# Patient Record
Sex: Female | Born: 1940 | Race: White | Hispanic: No
Health system: Southern US, Community
[De-identification: ages and names within clinical notes are randomized; demographics above are authoritative.]

---

## 2005-03-27 ENCOUNTER — Emergency Department: Payer: Self-pay | Admitting: Emergency Medicine

## 2005-07-13 ENCOUNTER — Ambulatory Visit: Payer: Self-pay | Admitting: Internal Medicine

## 2005-07-29 ENCOUNTER — Ambulatory Visit: Payer: Self-pay | Admitting: *Deleted

## 2005-11-13 ENCOUNTER — Emergency Department: Payer: Self-pay | Admitting: Emergency Medicine

## 2005-11-13 ENCOUNTER — Other Ambulatory Visit: Payer: Self-pay

## 2007-09-16 ENCOUNTER — Other Ambulatory Visit: Payer: Self-pay

## 2007-09-16 ENCOUNTER — Inpatient Hospital Stay: Payer: Self-pay | Admitting: Internal Medicine

## 2007-09-21 ENCOUNTER — Encounter: Payer: Self-pay | Admitting: Internal Medicine

## 2007-10-01 ENCOUNTER — Encounter: Payer: Self-pay | Admitting: Internal Medicine

## 2008-02-09 ENCOUNTER — Other Ambulatory Visit: Payer: Self-pay

## 2008-02-09 ENCOUNTER — Inpatient Hospital Stay: Payer: Self-pay | Admitting: Orthopedic Surgery

## 2008-02-13 ENCOUNTER — Encounter: Payer: Self-pay | Admitting: Internal Medicine

## 2008-08-06 ENCOUNTER — Ambulatory Visit: Payer: Self-pay | Admitting: Orthopedic Surgery

## 2008-08-08 ENCOUNTER — Ambulatory Visit: Payer: Self-pay | Admitting: Family Medicine

## 2008-09-10 ENCOUNTER — Ambulatory Visit: Payer: Self-pay | Admitting: Orthopedic Surgery

## 2009-08-14 ENCOUNTER — Inpatient Hospital Stay: Payer: Self-pay | Admitting: Orthopedic Surgery

## 2009-08-26 ENCOUNTER — Encounter: Payer: Self-pay | Admitting: Internal Medicine

## 2009-08-30 ENCOUNTER — Encounter: Payer: Self-pay | Admitting: Internal Medicine

## 2009-09-18 ENCOUNTER — Ambulatory Visit: Payer: Self-pay | Admitting: Internal Medicine

## 2009-09-30 ENCOUNTER — Encounter: Payer: Self-pay | Admitting: Internal Medicine

## 2009-10-03 ENCOUNTER — Inpatient Hospital Stay: Payer: Self-pay | Admitting: Internal Medicine

## 2009-11-02 ENCOUNTER — Inpatient Hospital Stay: Payer: Self-pay | Admitting: Internal Medicine

## 2010-05-12 ENCOUNTER — Ambulatory Visit: Payer: Self-pay | Admitting: Specialist

## 2010-05-19 ENCOUNTER — Ambulatory Visit: Payer: Self-pay | Admitting: Ophthalmology

## 2010-05-29 ENCOUNTER — Ambulatory Visit: Payer: Self-pay | Admitting: Cardiovascular Disease

## 2010-06-01 ENCOUNTER — Ambulatory Visit: Payer: Self-pay | Admitting: Ophthalmology

## 2010-09-08 ENCOUNTER — Ambulatory Visit: Payer: Self-pay | Admitting: Gastroenterology

## 2010-11-19 ENCOUNTER — Ambulatory Visit: Payer: Self-pay | Admitting: Specialist

## 2011-05-14 ENCOUNTER — Other Ambulatory Visit: Payer: Self-pay | Admitting: Family Medicine

## 2011-07-22 ENCOUNTER — Other Ambulatory Visit: Payer: Self-pay | Admitting: Family Medicine

## 2011-08-27 IMAGING — CT CT CHEST W/ CM
1 series · 15 of 32 positions shown, 19 images · IV contrast (APPLIED)
Comparison: none

REASON FOR EXAM: ACUTE HYPOXIA SOB  call report    [id number]
COMMENTS:

[Series 4: soft tissue · axial · 0.78mm/px · z∈[-723,-462]mm · 15 of 98 slices shown, 19 images]
[im 7/98  soft-tissue]
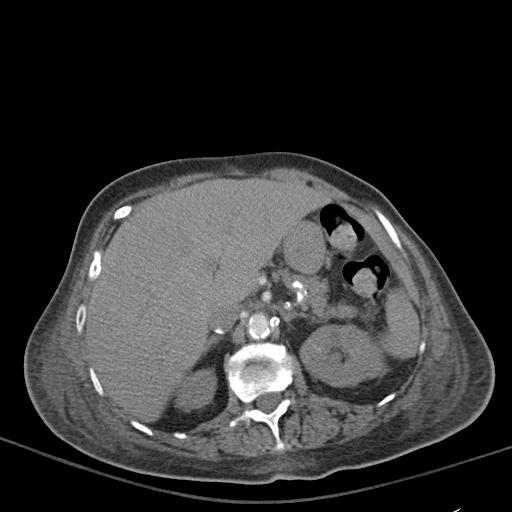
[im 7/98  bone]
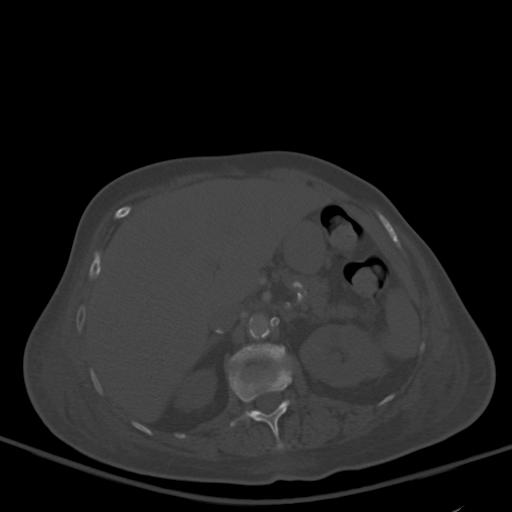
[im 13/98  soft-tissue]
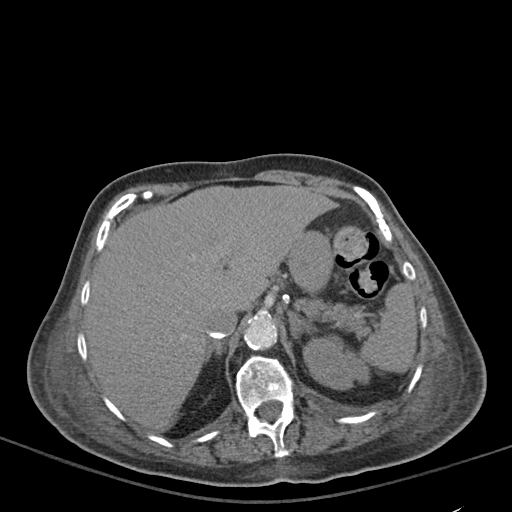
[im 19/98  soft-tissue]
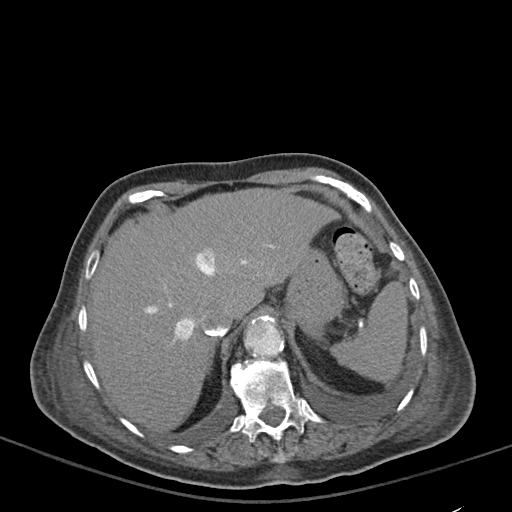
[im 29/98  soft-tissue]
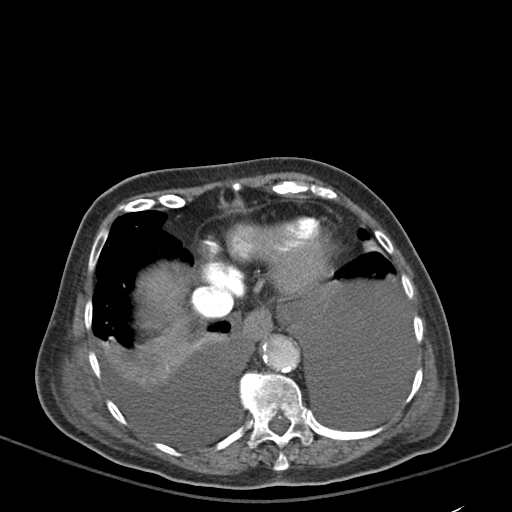
[im 35/98  soft-tissue]
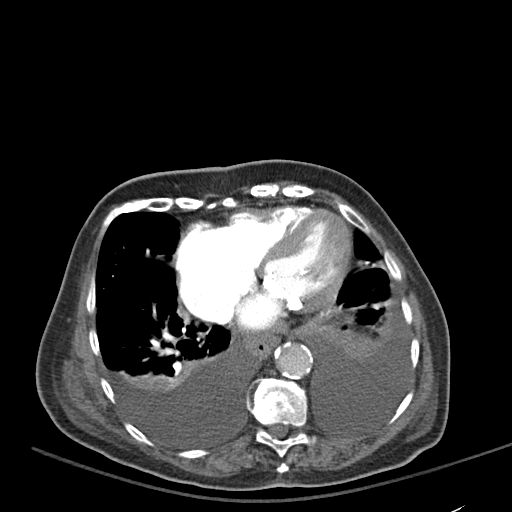
[im 41/98  soft-tissue]
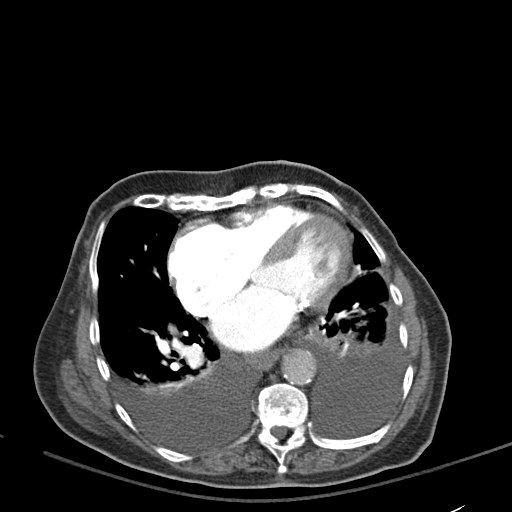
[im 51/98  soft-tissue]
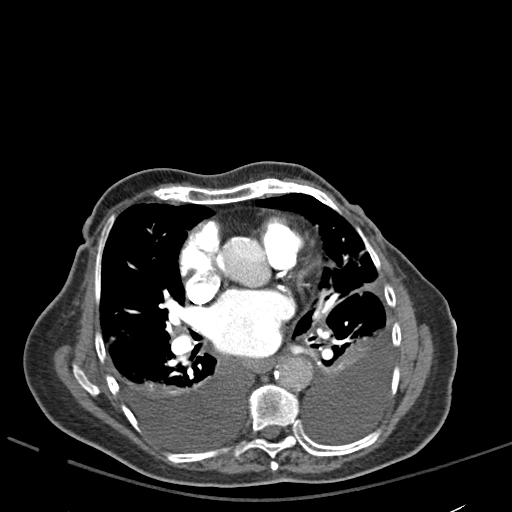
[im 57/98  soft-tissue]
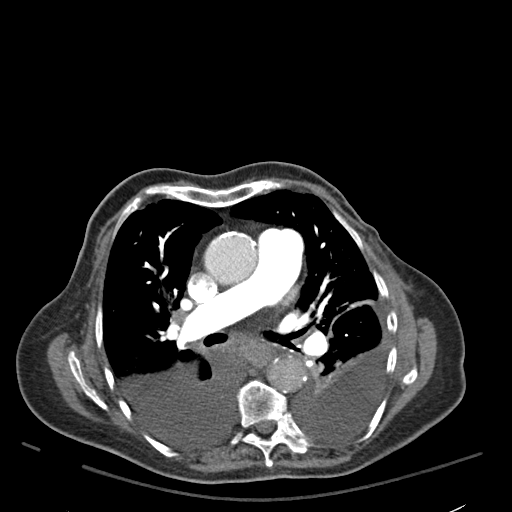
[im 63/98  soft-tissue]
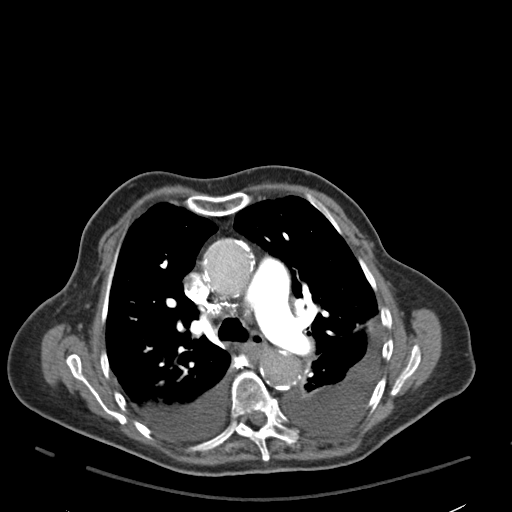
[im 63/98  bone]
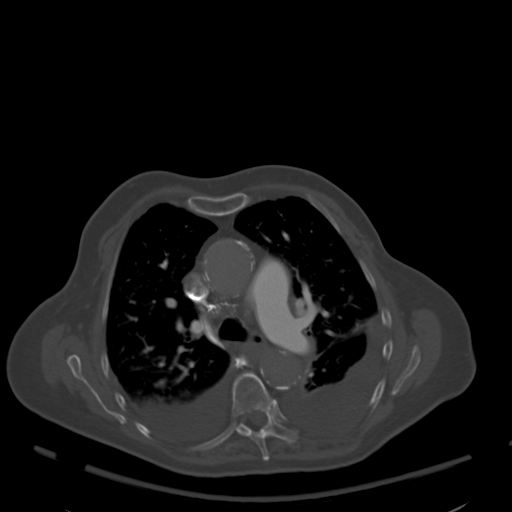
[im 69/98  soft-tissue]
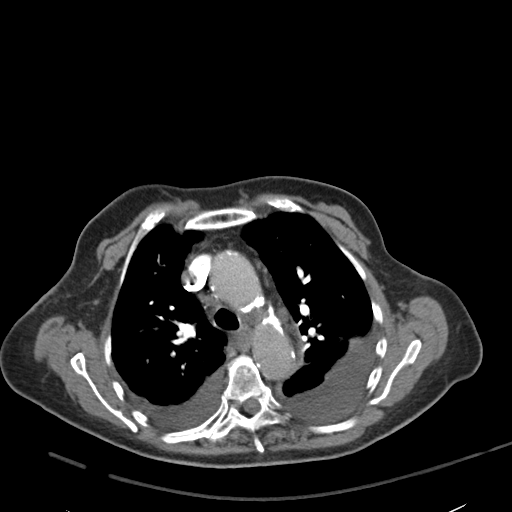
[im 79/98  soft-tissue]
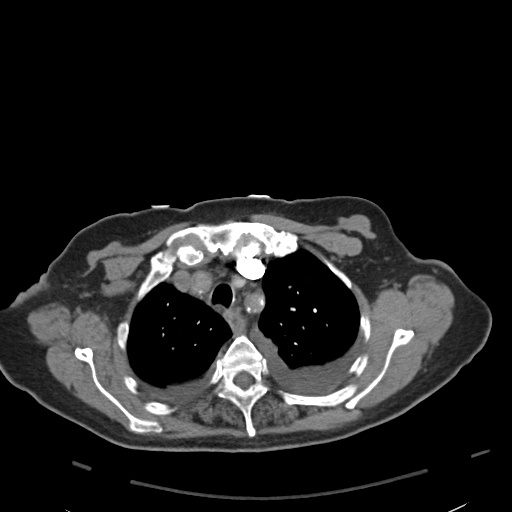
[im 85/98  soft-tissue]
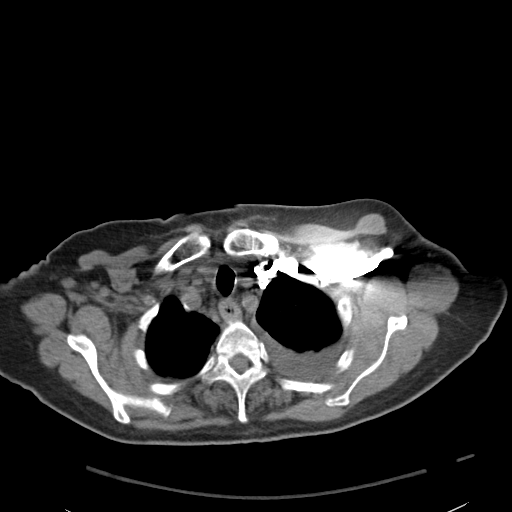
[im 85/98  lung]
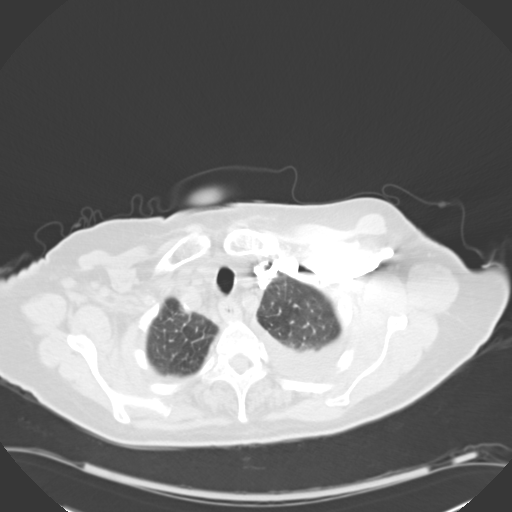
[im 88/98  lung]
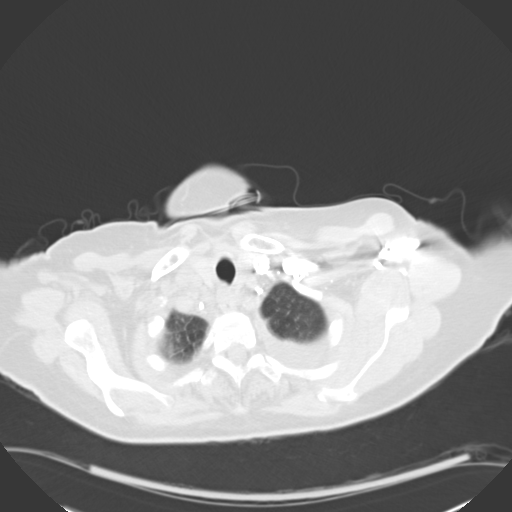
[im 91/98  soft-tissue]
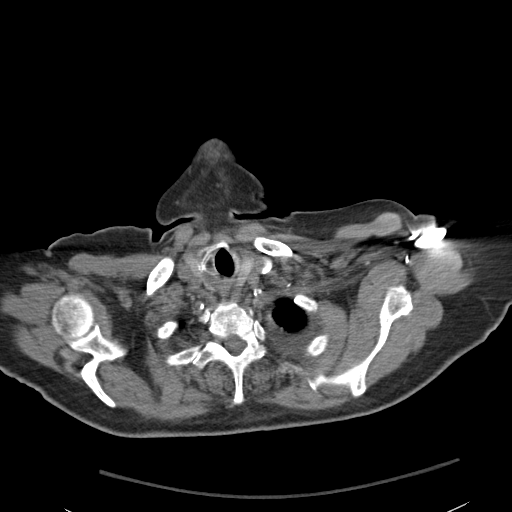
[im 91/98  lung]
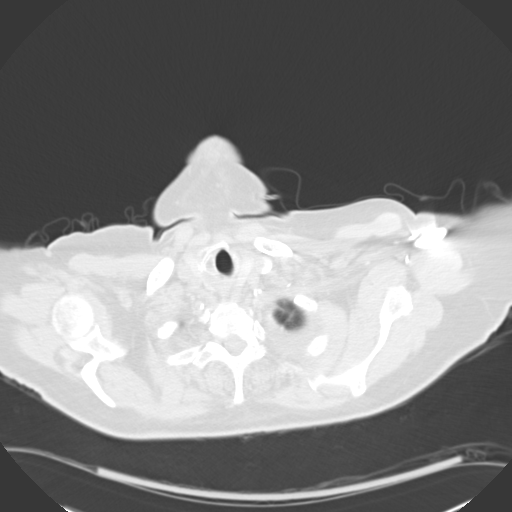
[im 94/98  lung]
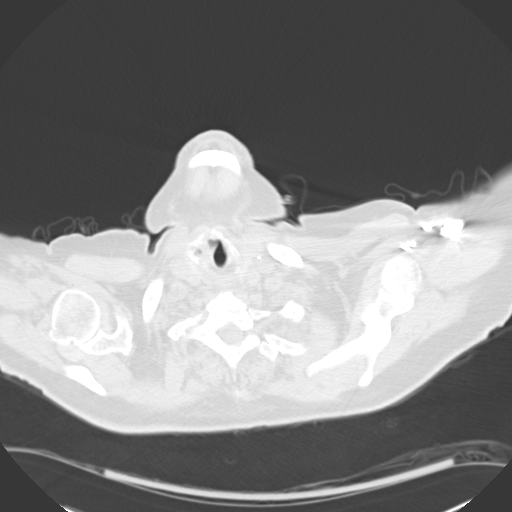

[15 of 32 positions shown; findings below may reference images not displayed]

PROCEDURE:     CT  - CT CHEST (FOR PE) W  - September 18, 2009  [DATE]

RESULT:     Axial CT scanning was performed through the chest at 3 mm
intervals and slice thicknesses following intravenous administration of 70
cc of Isovue 370. Review of 3-dimensional reconstructed images was performed
separately on the webspace server monitor.

There are moderate-sized bilateral pleural effusions layering posteriorly.
Contrast within the pulmonary arterial tree is normal in appearance. I do
not see evidence of acute pulmonary embolism. The cardiac chambers are
enlarged. The caliber of the thoracic aorta is normal. There is a hiatal
hernia present. I do not see pathologic sized mediastinal or hilar lymph
nodes.

At lung window settings there are increased interstitial markings diffusely
consistent with interstitial edema. The thoracic vertebral bodies are
preserved in height. There is a 5 mm diameter nodule posteroinferiorly in
the right middle lobe demonstrated on image 38. Within the upper abdomen the
observed portions of the liver and spleen appear normal. There is mild
fullness of the left adrenal gland but no discrete mass is seen.
IMPRESSION: 1. I do not see evidence of an acute pulmonary embolism.
2. There are moderate-sized bilateral pleural effusions.
3. There is enlargement of the cardiac chambers and there is increased
interstitial density within both lungs consistent with CHF.

This report was called to Dr. Carline at the conclusion of the study.

## 2011-09-11 IMAGING — CR DG CHEST 1V PORT
1 series · 1 of 1 positions shown · non-contrast
Comparison: none

REASON FOR EXAM: SOB
COMMENTS:

[view not recorded]
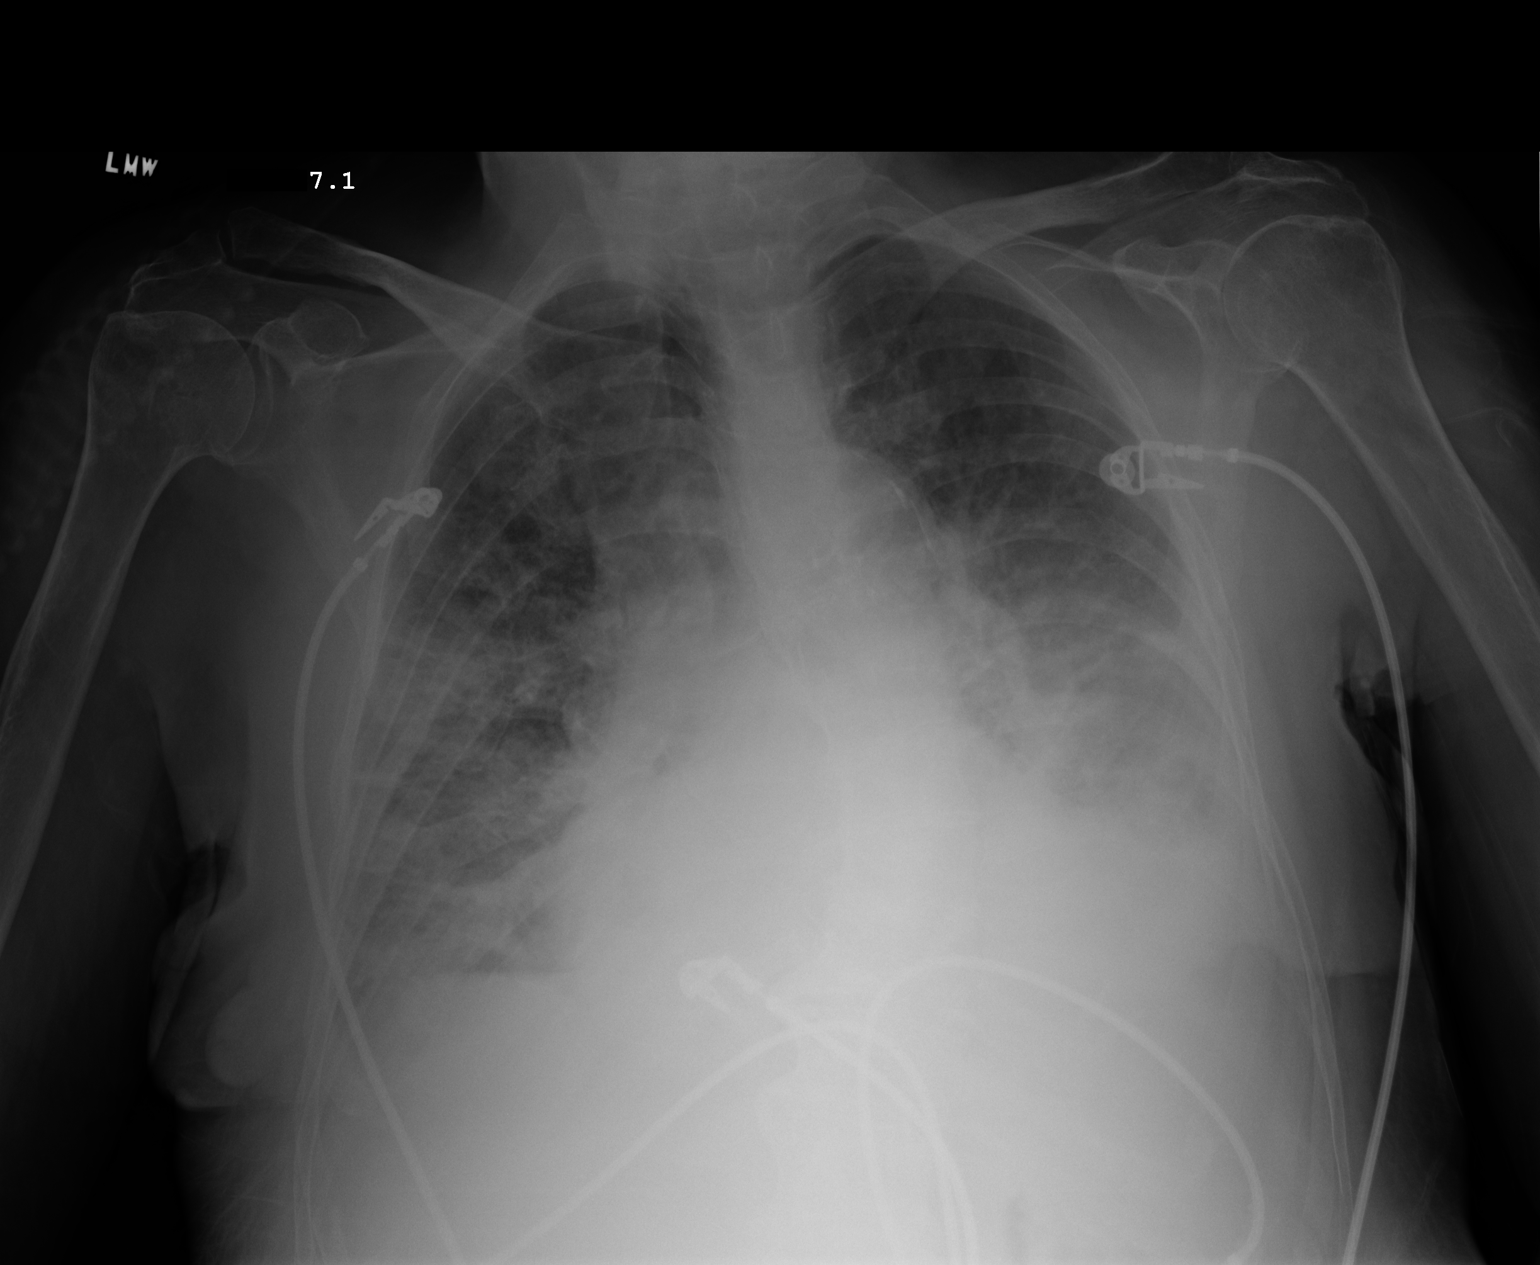

[1 of 1 positions shown; findings below may reference images not displayed]

PROCEDURE:     DXR - DXR PORTABLE CHEST SINGLE VIEW  - October 03, 2009  [DATE]

RESULT:     Comparison is made to the study 21 October, 2008.

The lungs are adequately inflated. The interstitial markings are increased
diffusely and have become confluent. The cardiac silhouette is top normal to
mildly enlarged but its margins are ill-defined. The left hemidiaphragm is
obscured.
IMPRESSION: The findings are consistent with congestive heart failure
with acute pulmonary edema. There may be pleural fluid on the left. Followup
films following therapy are recommended to assure clearing. Certainly
bilateral pneumonia could produce similar findings.

## 2011-09-11 IMAGING — US US EXTREM LOW VENOUS BILAT
1 series · 17 of 24 positions shown · non-contrast
Comparison: none

REASON FOR EXAM: swelling
COMMENTS:

[Series 1: us extrem low venous bilat · 17 of 43 slices shown]
[im 1/43]
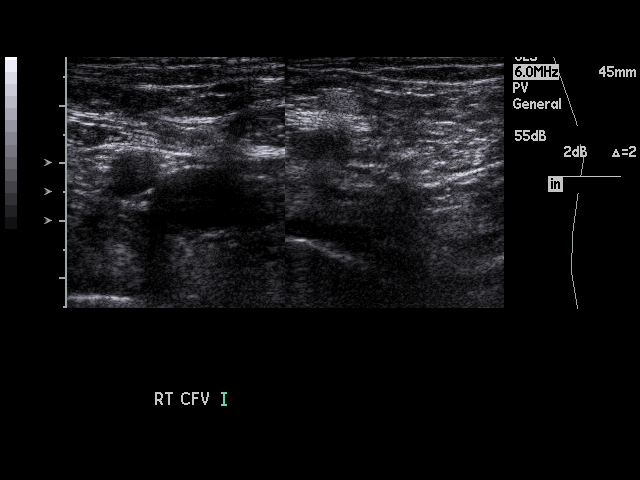
[im 4/43]
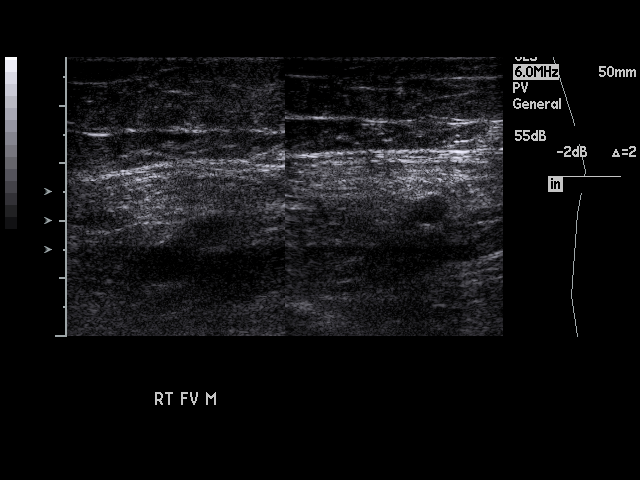
[im 6/43]
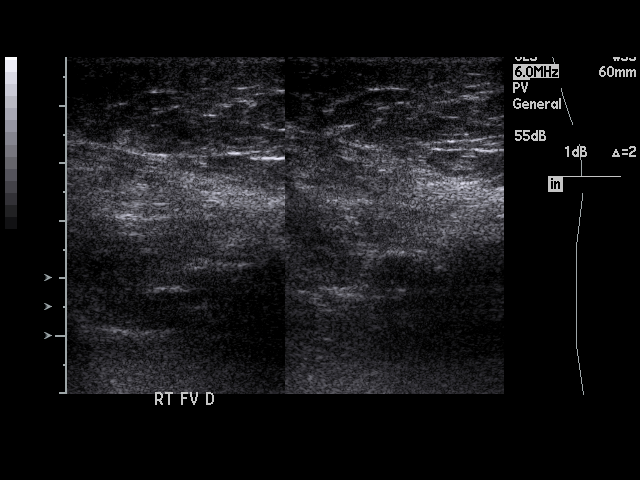
[im 8/43]
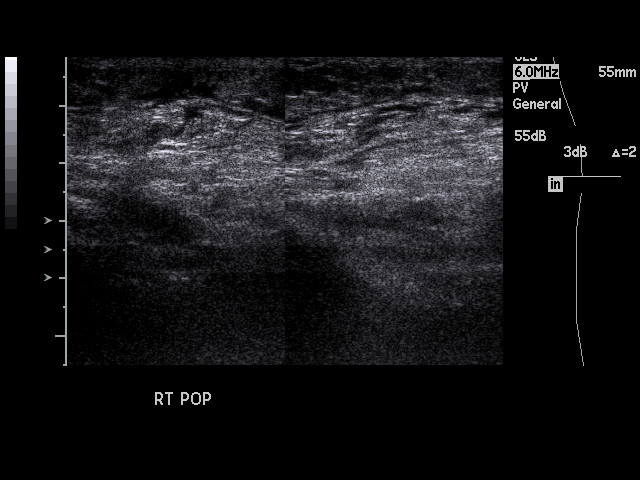
[im 11/43]
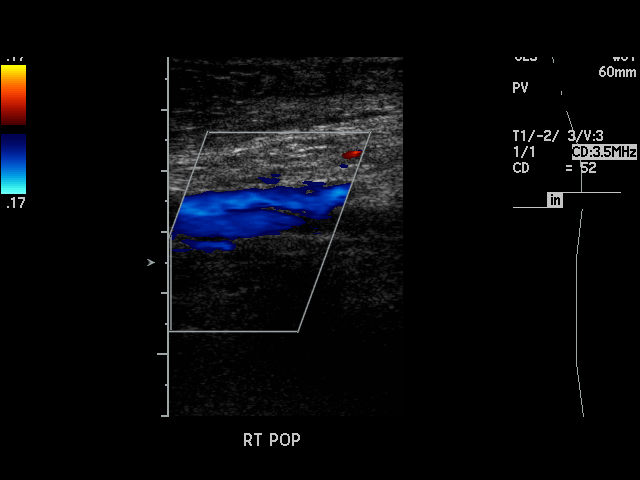
[im 13/43]
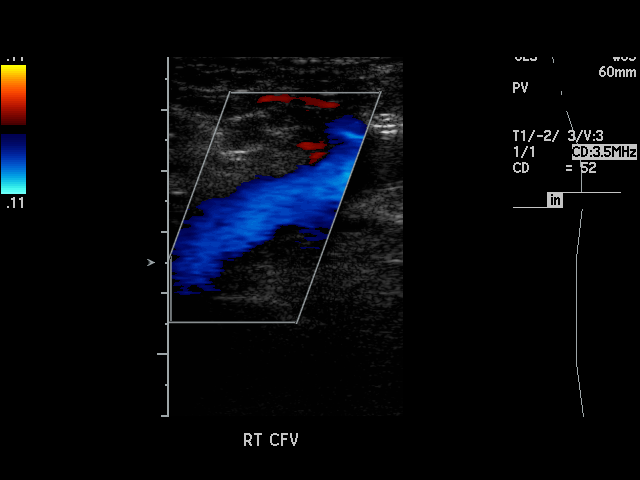
[im 17/43]
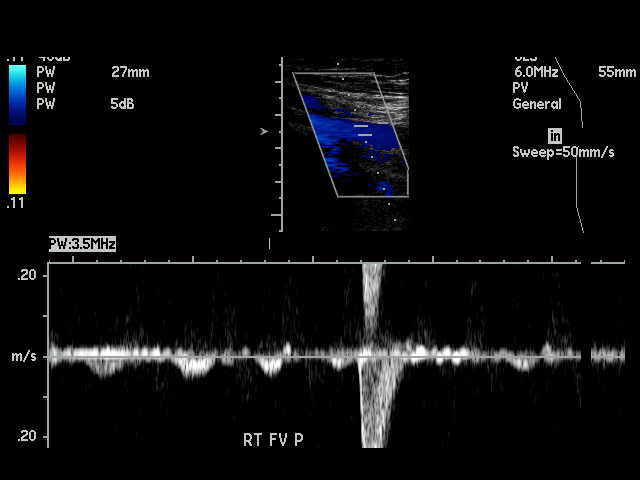
[im 19/43]
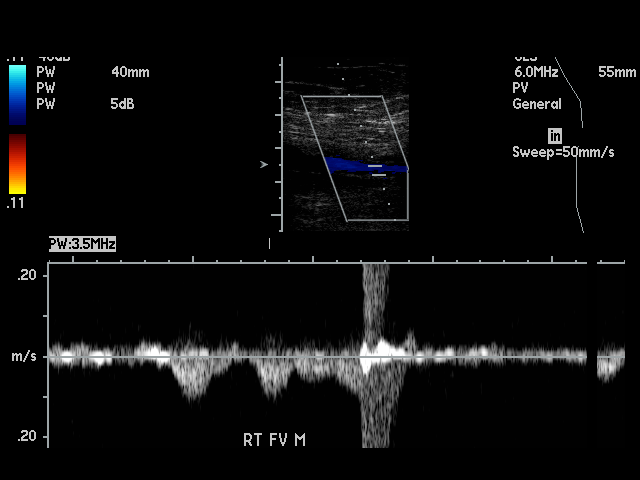
[im 22/43]
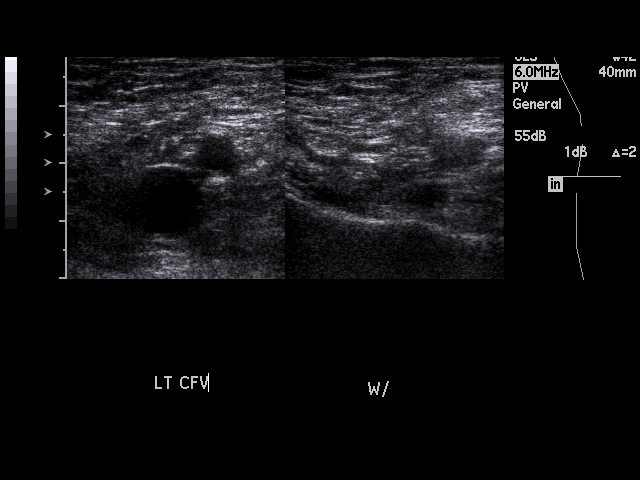
[im 24/43]
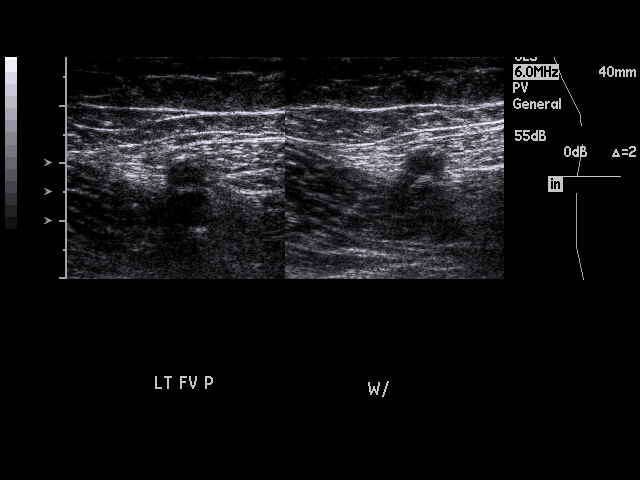
[im 26/43]
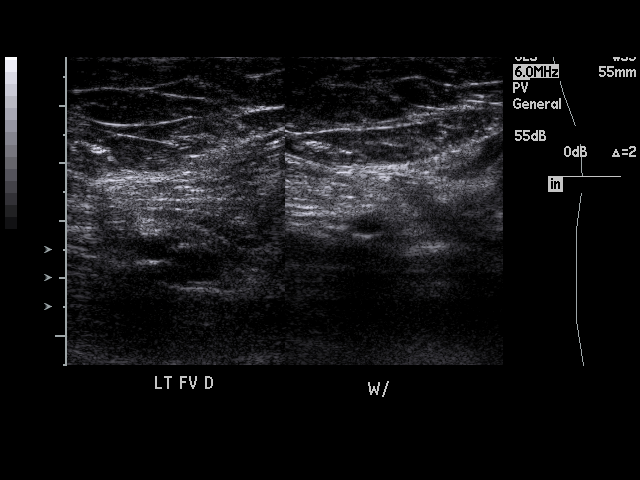
[im 30/43]
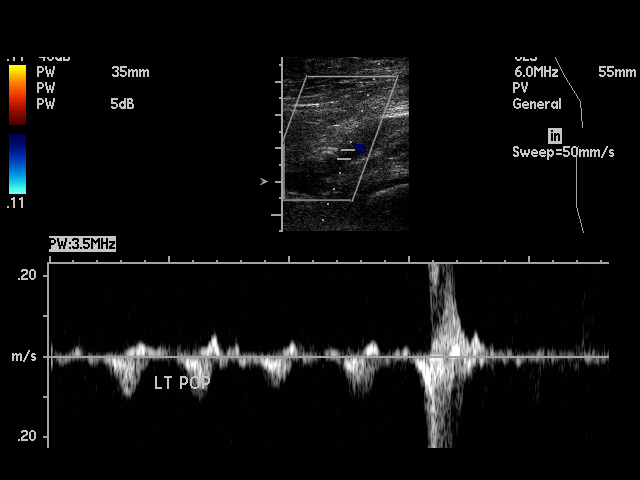
[im 32/43]
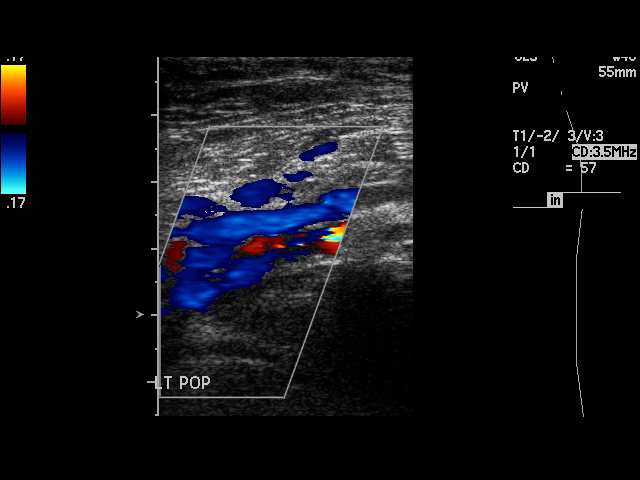
[im 35/43]
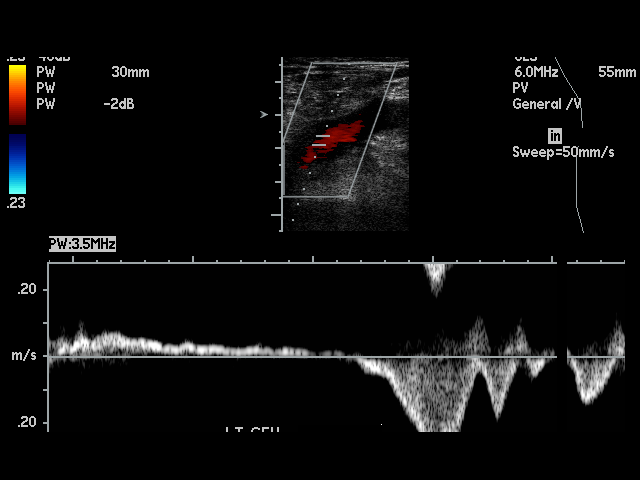
[im 37/43]
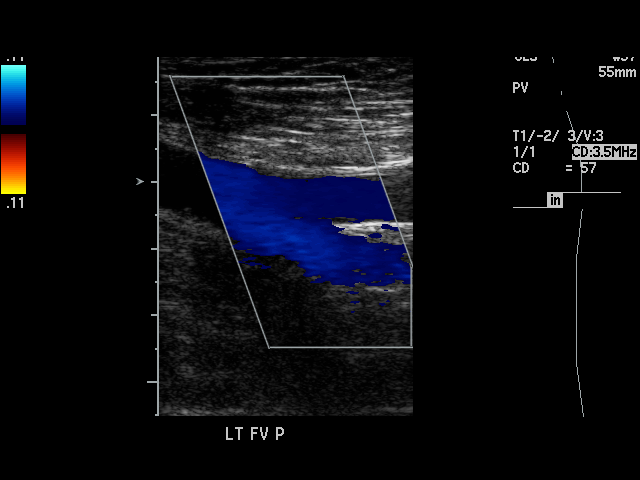
[im 39/43]
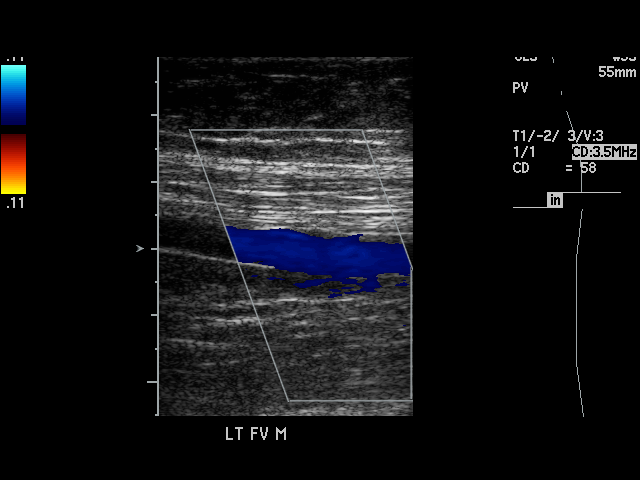
[im 43/43]
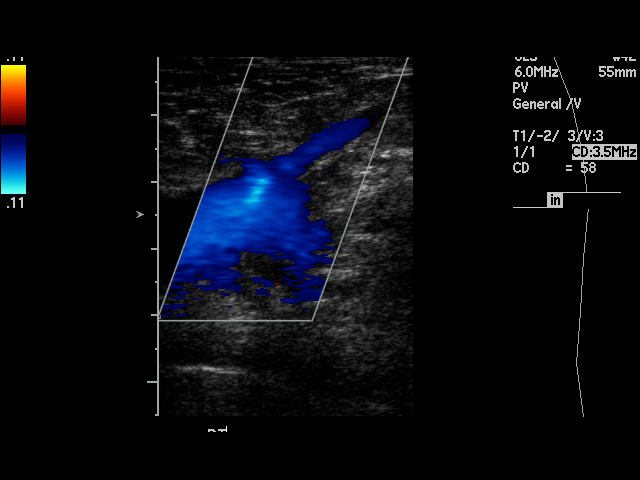

[17 of 24 positions shown; findings below may reference images not displayed]

PROCEDURE:     US  - US DOPPLER LOW EXTR BILATERAL  - October 03, 2009  [DATE]

RESULT:     Duplex Doppler interrogation of the deep venous system of both
legs from the inguinal to the popliteal region demonstrates the deep venous
systems are fully compressible throughout. The color Doppler and spectral
Doppler appearance is normal. There is normal response to distal
augmentation. The color Doppler images show no filling defect.
IMPRESSION: 1. No evidence of DVT in either lower extremity.

## 2011-09-11 IMAGING — CR RIGHT HIP - 1 VIEW
1 series · 2 of 2 positions shown · non-contrast
Comparison: none

REASON FOR EXAM: pain, no fall, h/o R hip hemiarthoplasty
COMMENTS:

PROCEDURE:     DXR - DXR HIP RIGHT ONE VIEW  - October 03, 2009 [DATE]
RESULT:     Comparison is made to the study of 10/23/2008.
The patient is status post right hip arthroplasty. There is no hardware or
bony complication evident. Only AP projection is performed.

[Series 1: view not recorded · 0.17mm/px · 2 of 2 slices shown]
[im 1/2]
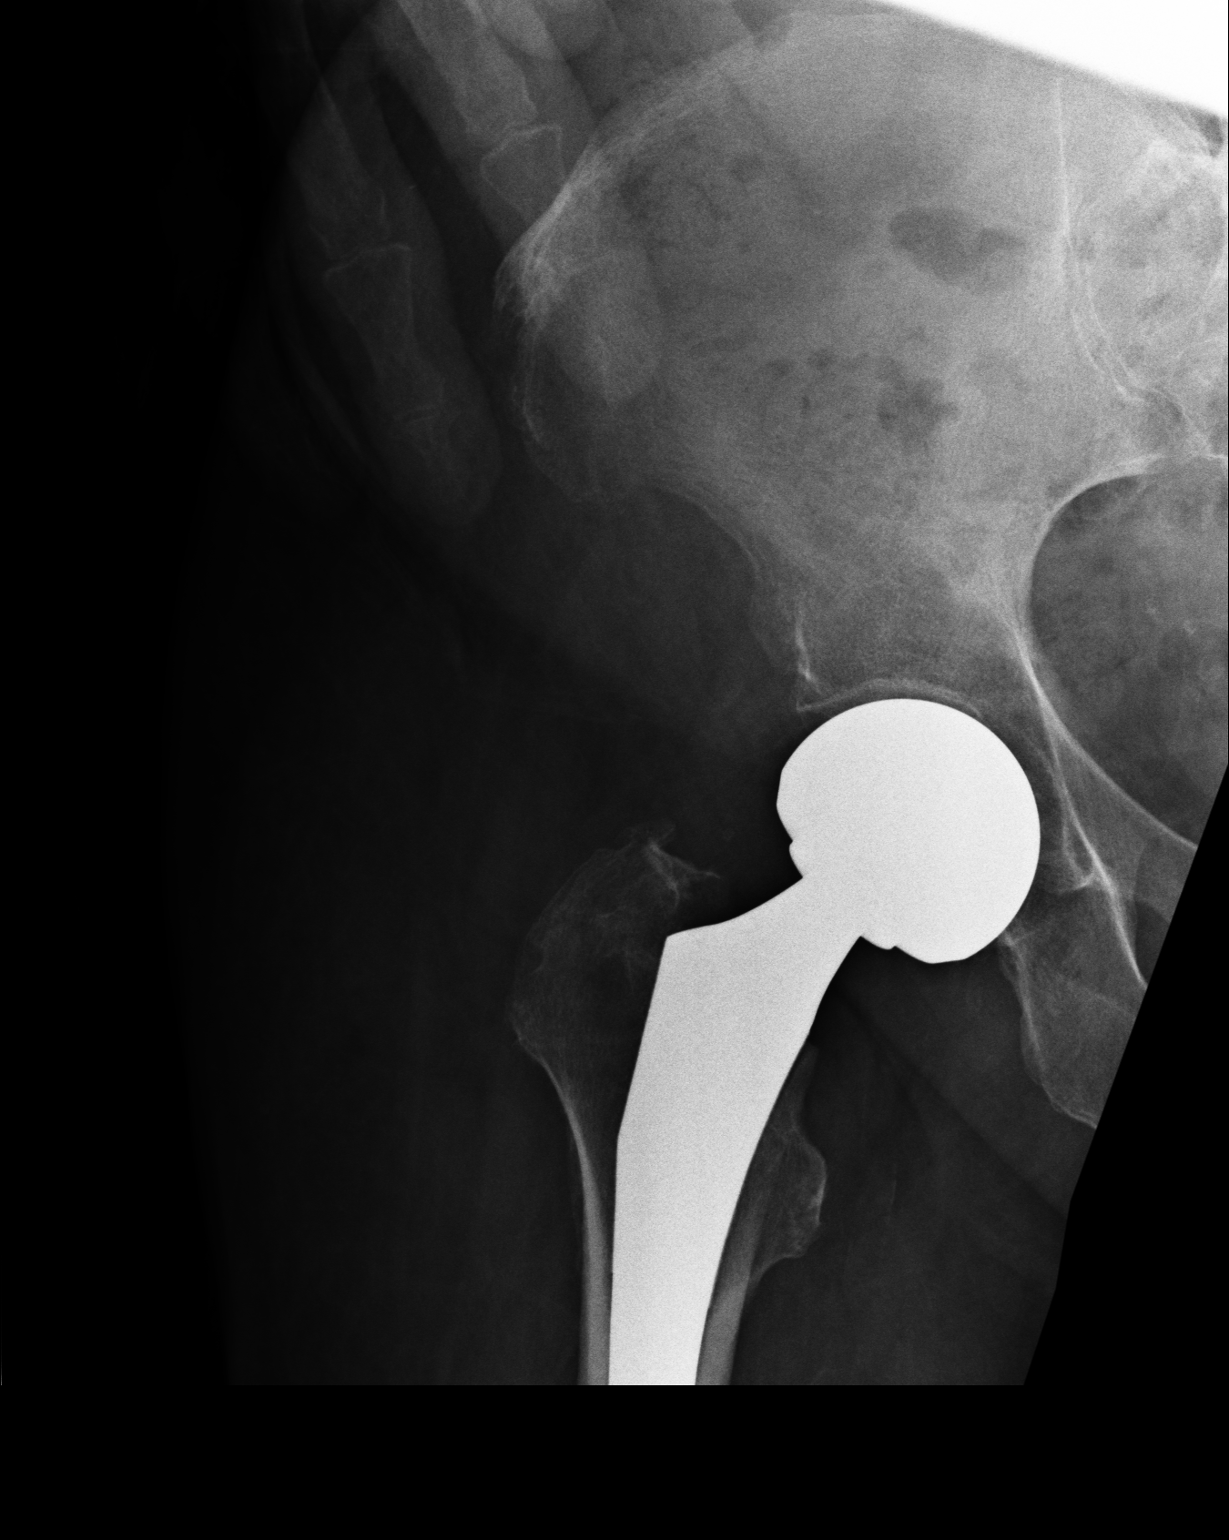
[im 2/2]
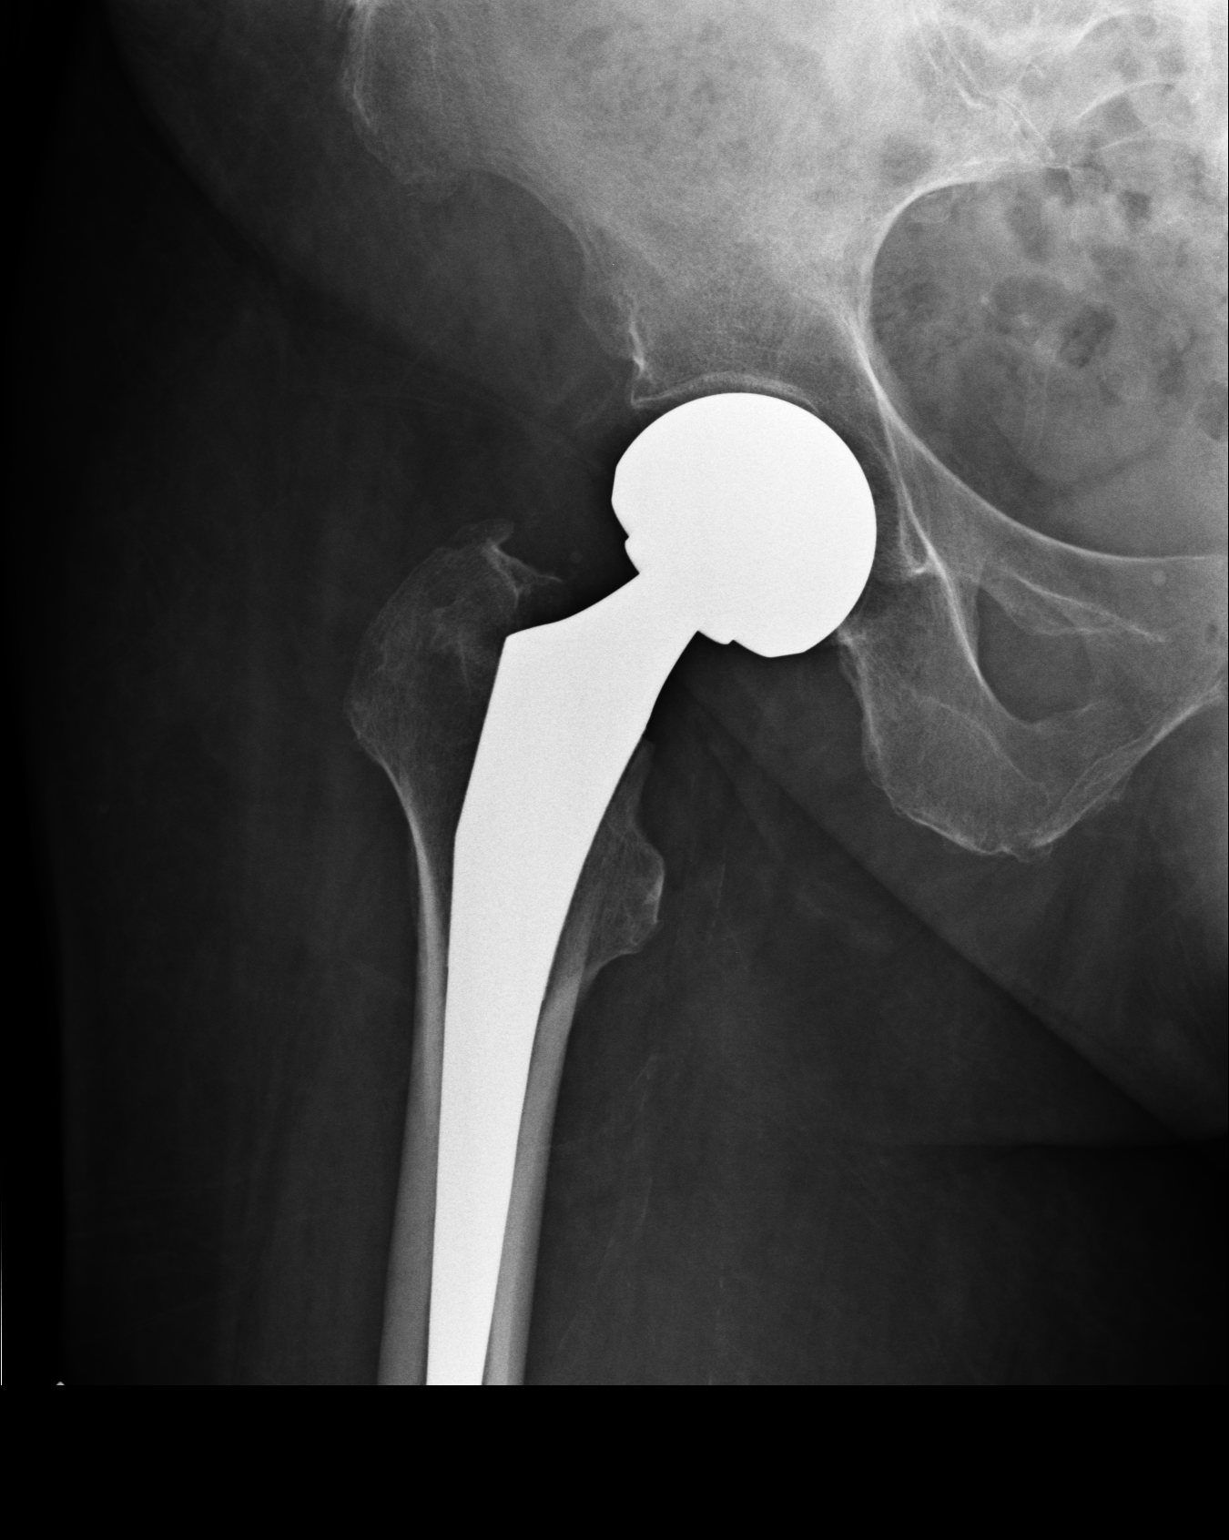

[2 of 2 positions shown; findings below may reference images not displayed]

IMPRESSION: Please see above.

## 2011-11-15 ENCOUNTER — Inpatient Hospital Stay: Payer: Self-pay | Admitting: Internal Medicine

## 2011-11-15 LAB — COMPREHENSIVE METABOLIC PANEL
Alkaline Phosphatase: 76 U/L (ref 50–136)
BUN: 14 mg/dL (ref 7–18)
Bilirubin,Total: 0.3 mg/dL (ref 0.2–1.0)
Calcium, Total: 9.5 mg/dL (ref 8.5–10.1)
Co2: 30 mmol/L (ref 21–32)
Creatinine: 0.97 mg/dL (ref 0.60–1.30)
EGFR (Non-African Amer.): >60
Osmolality: 277 (ref 275–301)
SGPT (ALT): 19 U/L
Sodium: 138 mmol/L (ref 136–145)
Total Protein: 7.6 g/dL (ref 6.4–8.2)

## 2011-11-15 LAB — CBC
HCT: 36.1 % (ref 35.0–47.0)
HGB: 11.7 g/dL — ABNORMAL LOW (ref 12.0–16.0)
MCH: 28.6 pg (ref 26.0–34.0)
MCHC: 32.3 g/dL (ref 32.0–36.0)
MCV: 89 fL (ref 80–100)
Platelet: 222 10*3/uL (ref 150–440)
RDW: 16.6 % — ABNORMAL HIGH (ref 11.5–14.5)

## 2011-11-15 LAB — CLOSTRIDIUM DIFFICILE BY PCR

## 2011-11-15 LAB — PROTIME-INR: INR: 5.1

## 2011-11-15 LAB — LIPASE, BLOOD: Lipase: 236 U/L (ref 73–393)

## 2011-11-16 LAB — CBC WITH DIFFERENTIAL/PLATELET
Basophil #: 0 10*3/uL (ref 0.0–0.1)
Eosinophil %: 4.4 %
Lymphocyte #: 1.6 10*3/uL (ref 1.0–3.6)
MCV: 88 fL (ref 80–100)
Monocyte %: 5.8 %
Neutrophil %: 65.8 %
Platelet: 183 10*3/uL (ref 150–440)
RBC: 3.22 10*6/uL — ABNORMAL LOW (ref 3.80–5.20)
RDW: 16.9 % — ABNORMAL HIGH (ref 11.5–14.5)
WBC: 6.7 10*3/uL (ref 3.6–11.0)

## 2011-11-16 LAB — BASIC METABOLIC PANEL
Anion Gap: 10 (ref 7–16)
Chloride: 107 mmol/L (ref 98–107)
Co2: 26 mmol/L (ref 21–32)
Osmolality: 284 (ref 275–301)
Potassium: 4 mmol/L (ref 3.5–5.1)

## 2011-11-16 LAB — HEMOGLOBIN: HGB: 10.4 g/dL — ABNORMAL LOW (ref 12.0–16.0)

## 2011-11-16 LAB — PROTIME-INR
INR: 1.8
Prothrombin Time: 29.5 secs — ABNORMAL HIGH (ref 11.5–14.7)

## 2011-11-16 LAB — WBCS, STOOL

## 2011-11-17 LAB — CBC WITH DIFFERENTIAL/PLATELET
Basophil %: 0.5 %
Eosinophil %: 3.3 %
HCT: 28.6 % — ABNORMAL LOW (ref 35.0–47.0)
HGB: 9.3 g/dL — ABNORMAL LOW (ref 12.0–16.0)
MCH: 28.8 pg (ref 26.0–34.0)
Monocyte #: 0.5 10*3/uL (ref 0.0–0.7)
Neutrophil #: 4.3 10*3/uL (ref 1.4–6.5)
Neutrophil %: 63.9 %
Platelet: 191 10*3/uL (ref 150–440)
RBC: 3.23 10*6/uL — ABNORMAL LOW (ref 3.80–5.20)

## 2011-11-17 LAB — PROTIME-INR: Prothrombin Time: 17.6 secs — ABNORMAL HIGH (ref 11.5–14.7)

## 2011-11-18 LAB — HEMOGLOBIN: HGB: 9.6 g/dL — ABNORMAL LOW (ref 12.0–16.0)

## 2011-11-18 LAB — STOOL CULTURE

## 2011-11-18 LAB — HEMATOCRIT: HCT: 29.8 % — ABNORMAL LOW (ref 35.0–47.0)

## 2011-11-19 LAB — CBC WITH DIFFERENTIAL/PLATELET
Basophil #: 0 10*3/uL (ref 0.0–0.1)
Eosinophil #: 0.4 10*3/uL (ref 0.0–0.7)
Eosinophil %: 4.6 %
HCT: 32 % — ABNORMAL LOW (ref 35.0–47.0)
Lymphocyte #: 0.9 10*3/uL — ABNORMAL LOW (ref 1.0–3.6)
MCH: 28.7 pg (ref 26.0–34.0)
MCHC: 32.1 g/dL (ref 32.0–36.0)
MCV: 90 fL (ref 80–100)
Monocyte #: 0.6 10*3/uL (ref 0.0–0.7)
Neutrophil #: 6.7 10*3/uL — ABNORMAL HIGH (ref 1.4–6.5)
Platelet: 199 10*3/uL (ref 150–440)
RBC: 3.58 10*6/uL — ABNORMAL LOW (ref 3.80–5.20)
RDW: 16.8 % — ABNORMAL HIGH (ref 11.5–14.5)
WBC: 8.6 10*3/uL (ref 3.6–11.0)

## 2011-11-20 DIAGNOSIS — I059 Rheumatic mitral valve disease, unspecified: Secondary | ICD-10-CM

## 2011-11-20 LAB — CBC WITH DIFFERENTIAL/PLATELET
Eosinophil %: 5.4 %
HCT: 30.8 % — ABNORMAL LOW (ref 35.0–47.0)
HGB: 10 g/dL — ABNORMAL LOW (ref 12.0–16.0)
Lymphocyte #: 1.3 10*3/uL (ref 1.0–3.6)
Lymphocyte %: 18.9 %
MCV: 89 fL (ref 80–100)
Monocyte %: 8.6 %
Neutrophil #: 4.4 10*3/uL (ref 1.4–6.5)
Platelet: 203 10*3/uL (ref 150–440)
RBC: 3.46 10*6/uL — ABNORMAL LOW (ref 3.80–5.20)
WBC: 6.6 10*3/uL (ref 3.6–11.0)

## 2011-11-20 LAB — BASIC METABOLIC PANEL
Anion Gap: 12 (ref 7–16)
BUN: 9 mg/dL (ref 7–18)
Chloride: 106 mmol/L (ref 98–107)
Creatinine: 0.95 mg/dL (ref 0.60–1.30)
EGFR (African American): >60
EGFR (Non-African Amer.): >60
Glucose: 86 mg/dL (ref 65–99)
Sodium: 146 mmol/L — ABNORMAL HIGH (ref 136–145)

## 2011-11-21 LAB — CBC WITH DIFFERENTIAL/PLATELET
Basophil #: 0 10*3/uL (ref 0.0–0.1)
Eosinophil %: 4.6 %
HGB: 9.9 g/dL — ABNORMAL LOW (ref 12.0–16.0)
Lymphocyte %: 22.2 %
MCHC: 32.7 g/dL (ref 32.0–36.0)
Monocyte #: 0.7 10*3/uL (ref 0.0–0.7)
Monocyte %: 9.2 %
Neutrophil #: 4.5 10*3/uL (ref 1.4–6.5)
Neutrophil %: 63.3 %
RBC: 3.38 10*6/uL — ABNORMAL LOW (ref 3.80–5.20)
RDW: 16.4 % — ABNORMAL HIGH (ref 11.5–14.5)
WBC: 7.1 10*3/uL (ref 3.6–11.0)

## 2011-11-21 LAB — BASIC METABOLIC PANEL
BUN: 6 mg/dL — ABNORMAL LOW (ref 7–18)
Calcium, Total: 9.2 mg/dL (ref 8.5–10.1)
Creatinine: 0.91 mg/dL (ref 0.60–1.30)
EGFR (African American): >60
EGFR (Non-African Amer.): >60
Osmolality: 282 (ref 275–301)

## 2011-11-21 LAB — PROTIME-INR: INR: 1.2

## 2011-11-22 LAB — HEMOGLOBIN: HGB: 9.7 g/dL — ABNORMAL LOW (ref 12.0–16.0)

## 2011-11-22 LAB — PROTIME-INR
INR: 1.2
Prothrombin Time: 15.1 secs — ABNORMAL HIGH (ref 11.5–14.7)

## 2011-11-23 LAB — MAGNESIUM: Magnesium: 2.1 mg/dL

## 2011-11-23 LAB — HEMOGLOBIN: HGB: 9.7 g/dL — ABNORMAL LOW (ref 12.0–16.0)

## 2011-11-24 LAB — PROTIME-INR: INR: 1.1

## 2011-11-25 LAB — PROTIME-INR: INR: 1.2

## 2011-11-26 LAB — PROTIME-INR: INR: 1.2

## 2012-01-29 DEATH — deceased

## 2014-12-22 NOTE — Consult Note (Signed)
Chief Complaint:   Subjective/Chief Complaint Covering for Dr. Gustavo Lah. No further bleeding. Hgb stable.   VITAL SIGNS/ANCILLARY NOTES: **Vital Signs.:   23-Mar-13 09:58   Vital Signs Type Q 4hr   Temperature Temperature (F) 97.8   Celsius 36.5   Temperature Source oral   Pulse Pulse 70   Pulse source per Dinamap   Respirations Respirations 18   Systolic BP Systolic BP 537   Diastolic BP (mmHg) Diastolic BP (mmHg) 69   Mean BP 84   BP Source Dinamap   Pulse Ox % Pulse Ox % 104   Pulse Ox Activity Level  At rest   Oxygen Delivery 4L   Brief Assessment:   Cardiac Regular    Respiratory clear BS    Gastrointestinal Normal   Routine Hem:  23-Mar-13 03:34    WBC (CBC) 6.6   RBC (CBC) 3.46   Hemoglobin (CBC) 10.0   Hematocrit (CBC) 30.8   Platelet Count (CBC) 203   MCV 89   MCH 28.9   MCHC 32.4   RDW 16.6  Routine Chem:  23-Mar-13 03:34    Glucose, Serum 86   BUN 9   Creatinine (comp) 0.95   Sodium, Serum 146   Potassium, Serum 3.2   Chloride, Serum 106   CO2, Serum 28   Calcium (Total), Serum 9.1   Osmolality (calc) 289   eGFR (African American) >60   eGFR (Non-African American) >60   Anion Gap 12  Routine Hem:  23-Mar-13 03:34    Neutrophil % 66.3   Lymphocyte % 18.9   Monocyte % 8.6   Eosinophil % 5.4   Basophil % 0.8   Neutrophil # 4.4   Lymphocyte # 1.3   Monocyte # 0.6   Eosinophil # 0.4   Basophil # 0.1   Assessment/Plan:  Assessment/Plan:   Assessment GI bleeding. No active bleeding right now.    Plan Plan for EGD on Monday with Dr. Gustavo Lah.   Electronic Signatures: Verdie Shire (MD)  (Signed 23-Mar-13 10:38)  Authored: Chief Complaint, VITAL SIGNS/ANCILLARY NOTES, Brief Assessment, Lab Results, Assessment/Plan   Last Updated: 23-Mar-13 10:38 by Verdie Shire (MD)

## 2014-12-22 NOTE — Discharge Summary (Signed)
PATIENT NAME:  Rachel Bean, Rachel Bean MR#:  [id number] DATE OF BIRTH:  July 10, 1941  DATE OF ADMISSION:  11/15/2011 DATE OF DISCHARGE:  11/26/2011  PRIMARY CARE PHYSICIAN: Dr. Sheppard Penton   REASON FOR ADMISSION: Rectal bleeding/bright red blood per rectum.   DISCHARGE DIAGNOSES:  1. Rectal bleeding. 2. Acquired coagulopathy with INR greater than 5 on admission requiring fresh frozen plasma. 3. Duodenitis. 4. Colon diverticulosis without active bleeding. 5. Acute posthemorrhagic anemia due to rectal bleeding and coagulopathy.  6. Acute on chronic respiratory failure (with acute part from pulmonary edema).  7. Chronic diastolic congestive heart failure. 8. Chronic atrial fibrillation with history of sick sinus syndrome, status post permanent pacemaker insertion. 9. History of cerebrovascular accident with residual right hemiparesis.  10. Chronic respiratory failure secondary to chronic obstructive pulmonary disease.  11. History of oxygen-dependent chronic obstructive pulmonary disease with chronic respiratory failure.  12. Gastroesophageal reflux disease. 13. Anxiety. 14. Severe mitral regurgitation and tricuspid regurgitation noted on echocardiogram. 15. History of coronary artery disease. 16. History of peripheral vascular disease. 17. Pulmonary hypertension. 18. Carotid artery stenosis status post left carotid endarterectomy.  19. DO NOT RESUSCITATE.   CONSULTATION: GI with Dr. Marva Panda.   DISCHARGE DISPOSITION: [facility] skilled nursing facility.    DISCHARGE MEDICATIONS: 1. Continuous oxygen at 2 liters per minute via nasal cannula continuously. 2. Lasix 20 mg p.o. daily.  3. Potassium chloride 10 mEq p.o. daily.  4. Coumadin 2 mg p.o. daily (dose decreased from 4 mg a day due to coagulopathy and rectal bleeding).  5. Protonix 40 mg p.o. b.i.d.  6. Albuterol metered dose inhaler 1 to 2 puffs inhaled every 4 to 6 hours p.r.n. shortness of breath or wheezing. 7. Zofran ODT 4  mg p.o. q.8 hours p.r.n. nausea or vomiting.   MEDICATIONS: Prior medications which patient is to continue and resume:  1. Lorazepam 0.5 mg 1 tab p.o. every eight hours p.r.n.  2. Hydrocodone/acetaminophen 5/325 mg 1 tablet p.o. q.4 hours p.r.n.  3. Iron sulfate 325 mg p.o. daily.  4. Imodium AD 2 mg p.o. q.i.d. p.r.n., not to exceed six doses in 24 hours. 5. Spiriva HandiHaler one cap inhaled daily.  6. Paroxetine 20 mg p.o. daily.  7. Digoxin 125 mcg p.o. daily.   DISCHARGE CONDITION: Improved, stable.   DISCHARGE ACTIVITY: As tolerated.   DISCHARGE DIET: Low sodium, ADA low-fat, low-cholesterol, high-fiber diverticular diet.   DISCHARGE INSTRUCTIONS:  1. Take medications as prescribed.  2. Return to Emergency Department for recurrence of symptoms or for any bleeding complications or for any abdominal pain, nausea, vomiting, or diarrhea.   FOLLOWUP INSTRUCTIONS:  1. Follow up with house M.D. at skilled nursing facility within one week. Patient needs repeat PT/INR in the next 2 to 3 days and repeat BMP and CBC within one week. 2. Follow up with Dr. Marva Panda within 3 to 4 weeks. 3. Follow up with your cardiologist within 3 to 4 weeks.   LABORATORY, DIAGNOSTIC AND RADIOLOGICAL DATA: Portable chest x-ray on admission: Heart size enlarged. Bilateral diffuse interstitial thickening. Left pleural effusion. More confluent left lower lobe airspace disease may represent atelectasis versus infiltrate. Findings were concerning for pulmonary edema versus interstitial pneumonitis secondary to infectious or inflammatory etiology.   CT chest with PE protocol 11/18/2011: No evidence of pulmonary embolus. There are small bilateral pleural effusions. There is interstitial infiltrate, possibly areas of air trapping. Interstitial findings may reflect sequela of pulmonary edema though infectious infiltrate cannot be excluded. Cardiomegaly. There is focal infiltrate  versus atelectasis left lung base findings  which may represent cirrhotic changes within the liver.   Chest x-ray PA and lateral 11/25/2011: Chest shows changes of congestive heart failure superimposed on chronic obstructive pulmonary disease. There is interval improvement as compared to prior examinations of earlier this month.   2-D echocardiogram 11/20/2011: LV systolic function normal. Ejection fraction greater than 55%. There is diastolic dysfunction with transmitral spectral Doppler flow pattern suggestive of impaired LV relaxation. Pacemaker lead in the right ventricle. Left atrium is mildly dilated. Mild to moderate MR. RV systolic pressure is elevated at 40 to 50 mmHg. Elevated RVSP consistent with mild to moderate pulmonary hypertension.    INR is 5.1 on the day of admission, 1.2 on the day of discharge. Stool culture with no growth 11/15/2011. Stool for Clostridium difficile toxin was negative from 11/05/2011. Hemoglobin 11.7 on admission and 9.3 on the day of discharge. LFTs normal on admission except for serum albumin slightly low at 3.3 and INR was up due to acquired coagulopathy caused by Coumadin.   Renal function normal on admission with BUN 14, creatinine 0.97.   Flexible sigmoidoscopy by Dr. Marva Panda 11/19/2011: Diverticulosis mid sigmoid colon and in the distal sigmoid colon. Multiple nonbleeding colonic angiectasias. There are remnants of black effluent that is heme-positive.   EGD 11/22/2011: Hiatal hernia. Normal stomach. Duodenitis.    BRIEF HISTORY/HOSPITAL COURSE: Patient is a very pleasant 74 year old female with extensive past medical history including hypertension, hyperlipidemia, chronic respiratory failure, oxygen-dependent chronic obstructive pulmonary disease, coronary artery disease, chronic systolic and diastolic congestive heart failure, ejection fraction 50%, severe MR and TR, CVA with residual right hemiparesis, peripheral vascular disease, carotid artery stenosis status post left carotid endarterectomy,  chronic atrial fibrillation and sick sinus syndrome status post permanent pacemaker insertion who is on anticoagulation at the time of admission who presented to the Emergency Department with complaints of bright red blood per rectum in addition to nausea, vomiting, abdominal pain. Please see dictated admission history and physical for pertinent details surrounding the onset of this hospitalization and please see below for further details.  1. Rectal bleeding with initial concern for lower GI bleed with some abdominal pain, nausea and vomiting. She was noted to be coagulopathic on admission with INR greater than 5 and had some rectal bleeding. Stool studies were obtained. Patient was admitted to the med/surg floor for further evaluation and management. She was given FFP for her coagulopathy and thereafter her coagulopathy resolved and her bleeding appeared to have subsided thereafter. She did lose some blood and had acute posthemorrhagic anemia but not to the point where she required blood transfusion. GI consultation was obtained and Dr. Marva Panda initially recommended flexible sigmoidoscopy. Patient went for flexible sigmoidoscopy on date mentioned above which revealed no active bleeding. She was noted to have sigmoid diverticulosis and multiple nonbleeding colonic angiectasias plus black heme-positive effluent noted for which she was recommended EGD. She underwent EGD on the date mentioned above revealing mild duodenitis and hiatal hernia but no active bleeding. GI recommends keeping patient on b.i.d. PPI therapy for now and exact cause of rectal bleeding was unclear but this may be a spontaneous bleed related to her coagulopathy. Since no active bleeding was noted and patient has remained hemodynamically stable, GI feels that it is safe to restart Coumadin but at a lower dose than before. Stool cultures were negative and Clostridium difficile toxin was negative. She was initially hydrated with IV fluids for  volume resuscitation purposes but thereafter this led to  some worsening shortness of breath and she is noted to have pulmonary edema and fluids were stopped and she had to be started on Lasix. She diuresed well with Lasix with improvement of her shortness of breath and improvement of chest x-ray findings of pulmonary edema and blood pressure started to become slightly low therefore her Lasix dose has been reduced and she has been tolerating this well thus far. She will be on a diverticular high-fiber diet. She was noted to return to the ER if she notices any further rectal bleeding but this had subsided after she received FFP and she has not had any further active rectal bleeding noted throughout the remainder of this hospitalization. With conservative measures her abdominal pain, nausea and vomiting have resolved and she is tolerating a normal consistency diet well at the time of discharge without any complications. For her duodenitis she will continue b.i.d. PPI therapy and will follow up with Dr. Marva PandaSkulskie as an outpatient.  2. For acute posthemorrhagic anemia, this is due to GI bleed and coagulopathy but she has remained hemodynamically stable and there were no indications for blood transfusion and she will need to have her hemoglobin and hematocrit closely monitored as an outpatient. Will continue iron therapy.  3. Acute on chronic hypoxic respiratory failure due to pulmonary edema likely caused by IV fluids in the setting of chronic diastolic congestive heart failure and after stopping her fluids and diuresing her with Lasix her acute respiratory failure has resolved and she is oxygenating at her baseline (baseline oxygen requirements via nasal cannula) and she will continue low dose Lasix for pulmonary edema and chest x-ray has shown improved pulmonary edema with diuresis. There was also concern for possible pneumonia and she was empirically kept on IV antibiotics and has completed a week's course of  antibiotics but clinically pneumonia was felt to be less likely as she has remained afebrile and without any cough.  4. History of congestive heart failure, prior systolic dysfunction, ejection fraction 50% with normal LVEF during this hospitalization. She is noted to have diastolic dysfunction with probable chronic diastolic congestive heart failure. Also moderate pulmonary hypertension. For her pulmonary edema she will be on Lasix and will not start her on beta blocker or ACE inhibitor as she is not taking one at baseline and would not start acutely given asymptomatic hypotension which is noted earlier and will defer further management to her cardiologist. 5. Chronic atrial fibrillation and sick sinus syndrome, status post permanent pacemaker insertion. She will continue heart rate control with digoxin. Initially her anticoagulation was held given supratherapeutic INR and rectal bleeding. Since no active bleeding was noted on EGD or colonoscopy it was felt to be safe from a GI perspective to restart Coumadin but at a lower dose and patient has a high CHADS2 score of 4 and would benefit from long-term anticoagulation for stroke prophylaxis especially given her prior history of cerebrovascular accident. She will be discharged on 2 mg of Coumadin per day and recommend repeat PT/INR in the next 2 to 3 days per house M.D. at skilled nursing facility. 6. History of hypertension. As above she had a few episodes of hypotension during this hospitalization which is likely medication induced and her Lasix has been reduced and after doing so blood pressure is well controlled.  7. Hypokalemia. Likely Lasix effect. After keeping her on potassium chloride supplement while she is on Lasix her serum potassium level has normalized. Of note, she is also hypomagnesemic and this was replaced and  has resolved.  8. History of cerebrovascular accident with residual right hemiparesis. Restarted Coumadin as above.   9. Gastroesophageal reflux disease. Patient to continue b.i.d. proton pump inhibitor given duodenitis per GIs recommendations. 10. Chronic obstructive pulmonary disease with chronic respiratory failure, stable. Patient to continue oxygen, Spiriva and p.r.n. albuterol metered-dose inhaler. 11. Anxiety/depression. Patient to continue Paxil and p.r.n. Ativan. 12. On 11/26/2011 patient was hemodynamically stable and without any rectal bleeding or any abdominal pain, nausea or vomiting and was felt to be stable for discharge back to skilled nursing facility with close outpatient follow up to which patient was agreeable.   TIME SPENT ON DISCHARGE: Greater than 30 minutes.  ____________________________ Elon Alas, MD knl:cms D: 11/26/2011 12:11:34 ET T: 11/26/2011 12:48:13 ET JOB#: [id number]  cc: Elon Alas, MD, <Dictator> Mickie Hillier. Sheppard Penton, MD [facility] [address] David Towson MD ELECTRONICALLY SIGNED 12/08/2011 22:02

## 2014-12-22 NOTE — Consult Note (Signed)
Brief Consult Note: Diagnosis: rectal bleeding.   Patient was seen by consultant.   Consult note dictated.   Discussed with Attending MD.   Comments: Appreciate consult for 74 y/o caucasian woman with multiple health issues for rectal bleeding. States she began with bright red blood per rectum Friday- reports that she filled the toilet bowel a few times. Reports some associated rectal pain, but that this has improved. States that the rectal bleeding is slowing down: 2 stools yesterday, and one burgundy stool today. Additionally reports malaise- and nausea with one epidsode of vomiting on Friday. Nausea better some this am. Feeling better generally today. Was found to have increased INR/PT and has received some vitamin K/FFP. Denies abdominal pain, no acid reflux symptoms. Had EGD with gastritis last year. Reports having colonoscopy, but I cannot find this.  Impression: rectal bleeding- Will get stat GIB scan. Continue to reverse coagulopathy. Transfuse prn. CBC this pm. If bleeding scan positive, obtain vascular consult.  Electronic Signatures: Vevelyn PatLondon, Abdulhamid Olgin H (NP)  (Signed 19-Mar-13 17:45)  Authored: Brief Consult Note   Last Updated: 19-Mar-13 17:45 by Keturah BarreLondon, Jayelyn Barno H (NP)

## 2014-12-22 NOTE — Consult Note (Signed)
Chief Complaint:   Subjective/Chief Complaint no repeat rectal bleeding today.  no abd pain or nausea.  denies shortness of breath or chest pain.   VITAL SIGNS/ANCILLARY NOTES: **Vital Signs.:   22-Mar-13 05:32   Vital Signs Type Routine   Temperature Temperature (F) 98.1   Celsius 36.7   Temperature Source oral   Pulse Pulse 72   Pulse source per Dinamap   Respirations Respirations 21   Systolic BP Systolic BP 106   Diastolic BP (mmHg) Diastolic BP (mmHg) 61   Mean BP 76   BP Source Dinamap   Pulse Ox % Pulse Ox % 94   Pulse Ox Activity Level  At rest   Oxygen Delivery 5L    10:23   Vital Signs Type Q 4hr   Temperature Temperature (F) 98.2   Celsius 36.7   Temperature Source oral   Pulse Pulse 74   Pulse source per Dinamap   Respirations Respirations 20   Systolic BP Systolic BP 102   Diastolic BP (mmHg) Diastolic BP (mmHg) 64   Mean BP 76   BP Source Dinamap   Pulse Ox % Pulse Ox % 94   Pulse Ox Activity Level  At rest   Oxygen Delivery 5L    14:01   Pulse Ox % Pulse Ox % 97   Pulse Ox Activity Level  At rest   Oxygen Delivery 5L; Nasal Cannula   Pulse Ox Heart Rate 73   Pulse Ox After Adjustment (RN or RCP Only) % 95   Oxygen Delivery Adjusted To (RN or RCP Only)  4L; Nasal Cannula   Brief Assessment:   Cardiac Irregular    Respiratory clear BS    Gastrointestinal details normal Soft  Nontender  Nondistended  No masses palpable  Bowel sounds normal   Routine Coag:  20-Mar-13 03:59    INR 1.4  Routine Hem:  22-Mar-13 07:23    WBC (CBC) 8.6   RBC (CBC) 3.58   Hemoglobin (CBC) 10.3   Hematocrit (CBC) 32.0   Platelet Count (CBC) 199   MCV 90   MCH 28.7   MCHC 32.1   RDW 16.8   Neutrophil % 77.9   Lymphocyte % 10.5   Monocyte % 6.7   Eosinophil % 4.6   Basophil % 0.3   Neutrophil # 6.7   Lymphocyte # 0.9   Monocyte # 0.6   Eosinophil # 0.4   Basophil # 0.0   Assessment/Plan:  Assessment/Plan:   Assessment 1) rectal bleeding while on  coumadin-anal outlet likely, ddx including diverticular, avms etc.  2) multiple medical problems with AF/pacemeker, chf, h/o cva, respiratory failure.    Plan 1) flex sig today.  I have discussed the risks, benefits, complications of flexible sigmoidoscopy to include not limited to bleeding infection perforation and sedation and she wishes to proceed.   Electronic Signatures: Barnetta ChapelSkulskie, Martin (MD)  (Signed 22-Mar-13 16:43)  Authored: Chief Complaint, VITAL SIGNS/ANCILLARY NOTES, Brief Assessment, Lab Results, Assessment/Plan   Last Updated: 22-Mar-13 16:43 by Barnetta ChapelSkulskie, Martin (MD)

## 2014-12-22 NOTE — Consult Note (Signed)
Chief Complaint:   Subjective/Chief Complaint no further rectal bleeding, denies abdominal pain.  INR near normal off coumadin.   VITAL SIGNS/ANCILLARY NOTES: **Vital Signs.:   20-Mar-13 09:35   Vital Signs Type Q 4hr   Temperature Temperature (F) 98.5   Celsius 36.9   Temperature Source oral   Pulse Pulse 66   Pulse source per Dinamap   Respirations Respirations 20   Systolic BP Systolic BP 90   Diastolic BP (mmHg) Diastolic BP (mmHg) 61   Mean BP 70   BP Source Dinamap   Pulse Ox % Pulse Ox % 94   Pulse Ox Activity Level  At rest   Oxygen Delivery 3L   Brief Assessment:   Cardiac Irregular    Respiratory clear BS    Gastrointestinal details normal Soft  Nontender  Nondistended  No masses palpable  Bowel sounds normal   Routine Hem:  18-Mar-13 15:21    Hemoglobin (CBC) 11.7  19-Mar-13 01:23    Hemoglobin (CBC) 9.7    04:03    Hemoglobin (CBC) 9.3    09:30    Hemoglobin (CBC) 10.1    17:56    Hemoglobin (CBC) 10.4  20-Mar-13 03:59    Hemoglobin (CBC) 9.3   Platelet Count (CBC) 191  Routine Coag:  20-Mar-13 03:59    INR 1.4   Assessment/Plan:  Assessment/Plan:   Assessment 1) rectal bleeding, not recurrent over 24 hours. Stable.  I recommend flex sig, however unable to do until friday due to scheduling.  discussed with Dr Nemiah CommanderKalisetti.  will arrange for o/p flex sig early this coming week.    Plan as above,   Electronic Signatures: Barnetta ChapelSkulskie, Meryl Ponder (MD)  (Signed 20-Mar-13 12:54)  Authored: Chief Complaint, VITAL SIGNS/ANCILLARY NOTES, Brief Assessment, Lab Results, Assessment/Plan   Last Updated: 20-Mar-13 12:54 by Barnetta ChapelSkulskie, Shiah Berhow (MD)

## 2014-12-22 NOTE — Discharge Summary (Signed)
PATIENT NAME:  Felipa FurnaceMARSHALL, Berniece L MR#:  [id number] DATE OF BIRTH:  03/26/1941  DATE OF ADMISSION:  11/15/2011 DATE OF DISCHARGE:  11/26/2011  ADDENDUM  DISCHARGE MEDICATIONS: Colace was previously written 100 mg p.o. daily and should be changed to 100 mg p.o. daily p.r.n. constipation rather than scheduled.  ____________________________ Elon AlasKamran N. Jadwiga Faidley, MD knl:cms D: 11/26/2011 12:18:51 ET T: 11/26/2011 12:27:57 ET JOB#: [id number]  cc: Elon AlasKamran N. Marlina Cataldi, MD, <Dictator> [facility] Scotty CourtKAMRAN N Salbador Fiveash MD ELECTRONICALLY SIGNED 12/08/2011 22:02

## 2014-12-22 NOTE — Consult Note (Signed)
Chief Complaint:   Subjective/Chief Complaint Please see full GI consult.  Patietn admitted with rectal bleeding.  Since admission she has had 2 episodes of marroon stool "bloody clots". Hemodynamically stable.  Patietn on coumadin as outpatient and was admitted with INR in excess of 5.  Has been given ffp and INR currently 1.8.  HGB stable without tfx.   Mild suprapubic discomfort no abdominal pain otherwise. DDX includes diverticular and anal outlet bleeding, most likely the latter in the setting of the anticoagulation.  Less likely ore other etiologies such as avms and dieulafoy lesions.   Continue obs, tfx as needed, reverse coagulopathy, get tagged bleeding scan for recurrent significant bleeding. Serial hgb.  May need to have flex sig prior to d/c.  Following.   VITAL SIGNS/ANCILLARY NOTES: **Vital Signs.:   19-Mar-13 18:33   Vital Signs Type Q 4hr   Temperature Temperature (F) 98.4   Celsius 36.8   Temperature Source oral   Pulse Pulse 75   Pulse source per Dinamap   Respirations Respirations 18   Systolic BP Systolic BP 91   Diastolic BP (mmHg) Diastolic BP (mmHg) 61   Mean BP 71   BP Source Dinamap   Pulse Ox % Pulse Ox % 92   Pulse Ox Activity Level  At rest   Oxygen Delivery 3L  *Intake and Output.:   19-Mar-13 10:10   Stool  burgandy-colored, bloody    10:58   Stool  Bloody dark stool    18:01   Stool  burgandy    18:56   Stool  Bloody dark stool with strong odor   Brief Assessment:   Cardiac Irregular    Respiratory clear BS    Gastrointestinal details normal Soft  Nontender  Nondistended  No masses palpable   Routine Hem:  18-Mar-13 15:21    Hemoglobin (CBC) 11.7  Routine Coag:  18-Mar-13 15:21    INR 5.1  Routine Hem:  19-Mar-13 01:23    Hemoglobin (CBC) 9.7    04:03    Hemoglobin (CBC) 9.3   Platelet Count (CBC) 183  Routine Coag:  19-Mar-13 04:03    INR 2.8  Routine Hem:  19-Mar-13 09:30    Hemoglobin (CBC) 10.1    17:56    Hemoglobin (CBC)  10.4  Routine Coag:  19-Mar-13 17:56    INR 1.8   Electronic Signatures: Barnetta ChapelSkulskie, Jakobee Brackins (MD)  (Signed 19-Mar-13 20:36)  Authored: Chief Complaint, VITAL SIGNS/ANCILLARY NOTES, Brief Assessment, Lab Results   Last Updated: 19-Mar-13 20:36 by Barnetta ChapelSkulskie, Miko Markwood (MD)

## 2014-12-22 NOTE — Consult Note (Signed)
Chief Complaint:   Subjective/Chief Complaint No bleeding. No abd pain. Hgb stable.   VITAL SIGNS/ANCILLARY NOTES: **Vital Signs.:   24-Mar-13 10:04   Vital Signs Type Q 4hr   Temperature Temperature (F) 98   Celsius 36.6   Temperature Source oral   Pulse Pulse 70   Pulse source per Dinamap   Respirations Respirations 18   Systolic BP Systolic BP 875   Diastolic BP (mmHg) Diastolic BP (mmHg) 78   Mean BP 98   BP Source Dinamap   Pulse Ox % Pulse Ox % 95   Pulse Ox Activity Level  At rest   Oxygen Delivery 4L   Brief Assessment:   Cardiac Regular    Respiratory clear BS    Gastrointestinal Normal   Routine Hem:  24-Mar-13 04:48    WBC (CBC) 7.1   RBC (CBC) 3.38   Hemoglobin (CBC) 9.9   Hematocrit (CBC) 30.3   Platelet Count (CBC) 244   MCV 90   MCH 29.2   MCHC 32.7   RDW 16.4  Routine Chem:  24-Mar-13 04:48    Glucose, Serum 86   BUN 6   Creatinine (comp) 0.91   Sodium, Serum 143   Potassium, Serum 3.2   Chloride, Serum 103   CO2, Serum 30   Calcium (Total), Serum 9.2   Osmolality (calc) 282   eGFR (African American) >60   eGFR (Non-African American) >60   Anion Gap 10  Routine Coag:  24-Mar-13 04:48    Prothrombin 16.0   INR 1.2  Routine Hem:  24-Mar-13 04:48    Neutrophil % 63.3   Lymphocyte % 22.2   Monocyte % 9.2   Eosinophil % 4.6   Basophil % 0.7   Neutrophil # 4.5   Lymphocyte # 1.6   Monocyte # 0.7   Eosinophil # 0.3   Basophil # 0.0   Assessment/Plan:  Assessment/Plan:   Assessment GI bleed. Stable.    Plan For EGD with Dr. Guadlupe Spanish tomorrow afternoon. Thanks   Electronic Signatures: Verdie Shire (MD)  (Signed 24-Mar-13 12:09)  Authored: Chief Complaint, VITAL SIGNS/ANCILLARY NOTES, Brief Assessment, Lab Results, Assessment/Plan   Last Updated: 24-Mar-13 12:09 by Verdie Shire (MD)

## 2014-12-22 NOTE — Consult Note (Signed)
Chief Complaint:   Subjective/Chief Complaint no further rectal bleeding, denies abd pain or nausea.   VITAL SIGNS/ANCILLARY NOTES: **Vital Signs.:   25-Mar-13 14:27   Vital Signs Type Routine   Temperature Temperature (F) 98.6   Celsius 37   Temperature Source oral   Pulse Pulse 78   Pulse source per Dinamap   Respirations Respirations 18   Systolic BP Systolic BP 119   Diastolic BP (mmHg) Diastolic BP (mmHg) 79   Mean BP 92   BP Source Dinamap   Pulse Ox % Pulse Ox % 93   Pulse Ox Activity Level  At rest   Oxygen Delivery 2L   Brief Assessment:   Cardiac Irregular    Respiratory clear BS    Gastrointestinal details normal Soft  Nontender  Nondistended  No masses palpable  Bowel sounds normal     Routine Hem:  24-Mar-13 04:48    Platelet Count (CBC) 244  Routine Chem:  25-Mar-13 03:29    Potassium, Serum 3.3   Magnesium, Serum 1.3  Routine Hem:  25-Mar-13 03:29    Hemoglobin (CBC) 9.7  Routine Coag:  25-Mar-13 03:29    Prothrombin 15.1   INR 1.2   Assessment/Plan:  Assessment/Plan:   Assessment 1) GI bleding, initially "bright red" felt to be rectal.  flex sig showed only black material heme positive c/w possible melena/ugi bleeding.  has been on protonis bid and no further bleeding since thursday.    Plan 1) egd today further recs to follow.   Electronic Signatures for Addendum Section:  Barnetta ChapelSkulskie, Shaundra Fullam (MD) (Signed Addendum [phone]-Mar-13 16:08)  I have discussed the risks benefits and complicationso of egd to include not limited to bleeding perforation and sedation and she wishes to proceed.   Electronic Signatures: Barnetta ChapelSkulskie, Shaney Deckman (MD)  (Signed [phone]-Mar-13 16:07)  Authored: Chief Complaint, VITAL SIGNS/ANCILLARY NOTES, Brief Assessment, Lab Results, Assessment/Plan   Last Updated: 25-Mar-13 16:08 by Barnetta ChapelSkulskie, Annette Bertelson (MD)

## 2014-12-22 NOTE — Consult Note (Signed)
Chief Complaint:   Subjective/Chief Complaint no recurrent evidence of bleeding upper or lower.  denies abdominal p;ain or nausea, wants more to eat.   VITAL SIGNS/ANCILLARY NOTES: **Vital Signs.:   26-Mar-13 13:36   Vital Signs Type Routine   Temperature Temperature (F) 98.6   Celsius 37   Temperature Source oral   Pulse Pulse 65   Pulse source per Dinamap   Respirations Respirations 20   Systolic BP Systolic BP 98   Diastolic BP (mmHg) Diastolic BP (mmHg) 53   Mean BP 68   BP Source Dinamap   Pulse Ox % Pulse Ox % 95   Pulse Ox Activity Level  At rest   Oxygen Delivery 4L   Brief Assessment:   Cardiac Irregular    Respiratory clear BS    Gastrointestinal details normal Soft  Nontender  Nondistended  No masses palpable  Bowel sounds normal   Routine Hem:  26-Mar-13 04:46    Hemoglobin (CBC) 9.7  Routine Chem:  26-Mar-13 04:46    Potassium, Serum 3.7   Magnesium, Serum 2.1   Assessment/Plan:  Assessment/Plan:   Assessment 1) gi bleeding uncertain etiology.  Patietn with duodenitis and diverticulosis.  no overt evidence of a particular source.  Case discussed with Dr  Cherylann RatelLateef, recommend to restart anticoagulation but continue daily or bid ppi, GI fu in 3-4 weeks.    Plan as above.   Electronic Signatures: Barnetta ChapelSkulskie, Martin (MD)  (Signed 26-Mar-13 16:58)  Authored: Chief Complaint, VITAL SIGNS/ANCILLARY NOTES, Brief Assessment, Lab Results, Assessment/Plan   Last Updated: 26-Mar-13 16:58 by Barnetta ChapelSkulskie, Martin (MD)

## 2014-12-22 NOTE — Consult Note (Signed)
PATIENT NAME:  Rachel Bean, Rachel Bean MR#:  [id number] DATE OF BIRTH:  1941/04/20  DATE OF CONSULTATION:  11/16/2011  REFERRING PHYSICIAN:   CONSULTING PHYSICIAN:  Keturah Barre, NP  PRIMARY CARE PHYSICIAN: Cay Schillings, MD  HISTORY OF PRESENT ILLNESS: Mrs. Salatino is a 74 year old Caucasian female with an extensive cardiac history including sick sinus syndrome, atrial fibrillation on chronic Coumadin therapy, CVA, left carotid endarterectomy, PVD, severe MR and TR, chronic respiratory failure, chronic obstructive pulmonary disease, and congestive heart failure who was admitted yesterday with multiple medical issues. Gastroenterology has been consulted at the request of Dr. Alford Highland due to some rectal bleeding. The patient tells me that she began with bright red blood per rectum on Friday. She reports that she filled the toilet bowl a few times. She does report some associated rectal pain, but states that this is improved. She states now that the rectal bleeding is slowing down. She had two bloody stools yesterday and one bloody stool that was burgundy in nature so far today. She additionally reports malaise and nausea with one episode of vomiting on Friday and states that the nausea is better today. She states she is generally feeling better today. On admission she was found to have an INR of 5.1 and has received some vitamin K and FFP. She denies abdominal pain, denies acid reflux symptoms. She had an EGD with some gastritis last year. She reports having colonoscopy a couple of years ago, but I cannot find record of this in the [facility] or the [facility] system. The patient seems to think that it was done here.   PAST MEDICAL HISTORY:  1. Sick sinus syndrome, status post pacemaker placement. 2. Coronary artery disease. 3. Hypertension. 4. Hyperlipidemia. 5. Atrial fibrillation with chronic Coumadin therapy. 6. Cerebrovascular accident with right-sided weakness. 7. Left carotid  endarterectomy. 8. Peripheral vascular disease. 9. Right femoral neck fracture. 10. Severe MR and TR by echocardiogram. 11. Chronic respiratory failure. 12. Chronic obstructive pulmonary disease, wears O2 at night. 13. Anxiety. 14. Congestive heart failure.   PAST SURGICAL HISTORY:  1. Right femoral neck fracture status post right hip hemiarthroplasty. 2. Right patella fracture status post ORIF. 3. Pacemaker. 4. Left carotid endarterectomy. 5. Partial hysterectomy.  6. Bilateral mastectomies. 7. Hernia repair.   ALLERGIES: Codeine.   MEDICATIONS:  1. Vicodin 5/325 mg 1 p.o. every 4 hours p.r.n.  2. Aspirin 81 mg p.o. daily.  3. Bumex 0.5 mg p.o. daily.  4. Coumadin 4 mg p.o. daily (this has been held here). 5. Digoxin 125 mcg p.o. daily.  6. Colace 100 mg p.o. twice a day. 7. Ferrous sulfate 325 mg p.o. daily.  8. Imodium AD p.r.n. diarrhea. 9. Potassium 10 mEq alternating with 20 mEq every other day. 10. Lorazepam 0.5 mg p.o. every eight hours p.r.n. anxiety.  11. Omeprazole 20 mg p.o. daily.  12. Paxil 20 mg p.o. daily.  13. Spiriva 1 inhalation daily.   FAMILY HISTORY: Reports mother died of old age. Father with colon cancer. No known history of liver disease or ulcers.   SOCIAL HISTORY: She lives at [facility] She used to work in Designer, fashion/clothing. Nonambulatory at present. No current smoking. No alcohol or drug use.  REVIEW OF SYSTEMS: CONSTITUTIONAL: No fever, chills, or sweats. Weight is stable, but does feel weak. HEENT: Wears glasses but no recent vision changes. No hearing loss, sore throat, or problems swallowing. CARDIOVASCULAR: No chest pain, changes to activity level, or palpitations. RESPIRATORY: Shortness of breath with exertion,  no change. No cough, no sputum of hemoptysis. GI: As noted. GU: Denies dysuria or hematuria. MUSCULOSKELETAL: No unusual muscle or joint pain. INTEGUMENT: No erythema, lesion, or rash. NEUROLOGIC: No recent fainting, seizures, or  dizziness. PSYCH: Does have history of anxiety. No complaints of this now. No complaints of depression. HEMATOLOGIC/LYMPHATIC: Is on iron presumably for iron deficiency anemia. ENDOCRINE: No history of diabetes or hypothyroidism.   MOST RECENT LABS/STUDIES: On 11/16/2011 serum glucose 78, BUN 14, creatinine 0.93, sodium 143, potassium 4.0, chloride 107, GFR greater than 60, calcium 9. WBC 6.7 and hemoglobin 9.3. At 9:30 hemoglobin was 10.1, hematocrit 28.4, platelets 183, MCV 88, MCH 29, MCHC 32.9, and RDW 16.9. PT 29.5. INR 2.8.   Stool studies: Culture was negative. C. difficile was  negative. White blood cells were positive with some red blood cells.   EKG demonstrated ventricular paced rhythm.   PHYSICAL EXAMINATION:   VITAL SIGNS: Most recent vital signs: Temperature 98.2, pulse 74, respiratory rate 20, blood pressure 92/50.   GENERAL: Appears comfortable. No acute distress.   HEENT: Normocephalic, atraumatic. No redness, drainage, or inflammation to the eyes or nares. Oral mucosa pink and moist.   NECK: No bruit, JVD, lymphadenopathy, or thyromegaly.   LUNGS: Respirations eupneic. Lungs slightly diminished at bases, otherwise clear to auscultation bilaterally.   HEART: S1 and S2. Grade 3/6 systolic murmur. Peripheral pulses 2+. No gallop. No appreciable edema.   ABDOMEN: Rounded abdomen. Bowel sounds x4. Soft, nontender, and nondistended. No hepatosplenomegaly, hernias, peritoneal signs, or rebound tenderness.   RECTAL: No obvious abnormalities. Stool is burgundy.   SKIN: No erythema, lesion, or rash.   GU: Normal female genitalia.   MUSCULOSKELETAL: No clubbing, cyanosis, or edema. Strength 4/5 to the legs, 5/5 to the arms.   NEUROLOGIC: Cranial nerves II through XII grossly intact. Alert and oriented x3. Speech clear. No facial droop.   PSYCHIATRIC: Mood stable, pleasant, cooperative. Logical train of thought.  ASSESSMENT AND PLAN: Continue to reverse coagulopathy. We will  get GI bleeding scan, transfuse p.r.n., and CBC this evening. If bleeding scan is positive, we will obtain vascular consult.   These services were provided by Vevelyn Pathristiane Florita Nitsch, MSN, NP-C in collaboration with Barnetta ChapelMartin Skulskie, MD.   ____________________________ Keturah Barrehristiane H. Breeona Waid, NP chl:slb D: 11/17/2011 15:43:02 ET T: 11/17/2011 15:59:12 ET JOB#: [id number]  cc: Keturah Barrehristiane H. [place] Ice, NP, <Dictator> Eustaquio MaizeHRISTIANE H Toryn Mcclinton FNP ELECTRONICALLY SIGNED 11/25/2011 11:33

## 2014-12-22 NOTE — H&P (Signed)
PATIENT NAME:  Rachel Bean, Rachel Bean MR#:  [id number] DATE OF BIRTH:  04-Jun-1941  DATE OF ADMISSION:  11/15/2011  PRIMARY CARE PHYSICIAN: Dr. Sheppard PentonWolf   CHIEF COMPLAINT: Not feeling good.   HISTORY OF PRESENT ILLNESS: This is a 74 year old female with multiple medical issues. She presents today after having 2 to 3 episodes of rectal bleeding bright red blood. She cannot quantify how much. She is on Coumadin and she was found to have an elevated INR of 5.1 and she also takes aspirin. Looking back over the past three years she had an upper endoscopy January 2012 that showed normal esophagus, gastric mucosal abnormality, mild erythema, normal duodenum. I do not see any colonoscopy report. The patient states that she got sick on Sunday with some nausea and vomiting and she has had some abdominal cramping since then. It comes and goes. It is cramping all over. Today had the bright red blood per rectum. She does complain of some weakness and shortness of breath which is ongoing.   PAST MEDICAL HISTORY:  1. Sick sinus syndrome status post pacemaker.  2. Coronary artery disease. 3. Hypertension. 4. Hyperlipidemia. 5. Atrial fibrillation on chronic Coumadin. 6. Cerebrovascular accident with right-sided weakness. 7. Left carotid endarterectomy and peripheral vascular disease. 8. Right femoral neck fracture. 9. Severe MR and TR by echocardiogram. 10. Chronic respiratory failure and chronic obstructive pulmonary disease, does wear oxygen at night.  11. Positive for anxiety.  12. Congestive heart failure.  PAST SURGICAL HISTORY:  1. Right femoral neck fracture and right hip hemiarthroplasty. 2. Right patellar fracture status post open reduction internal fixation.  3. Pacemaker.  4. Left carotid endarterectomy. 5. Partial hysterectomy. 6. Bilateral mastectomy,  7. Hernia repair.   ALLERGIES: Codeine.   MEDICATIONS:  1. Vicodin 5/325, 1 every four hours as needed for pain.  2. Aspirin 81 mg daily.   3. Bumex 0.5 mg daily.  4. Coumadin 4 mg daily.  5. Digoxin 125 mcg daily.  6. Colace 100 mg twice a day.  7. Ferrous sulfate 325 mg daily.  8. Imodium AD p.r.n. diarrhea. 9. Potassium 10 mEq alternating with 20 mEq. 10. Lorazepam 0.5 mg every eight hours as needed for anxiety.  11. Omeprazole 20 mg daily.  12. Paxil 20 mg daily.  13. Spiriva 1 inhalation daily.    FAMILY HISTORY: Mother died of old age. Father died of colon cancer.   SOCIAL HISTORY: Lives at [facility]. 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 Currently can't  walk. Smoking history in the past but not now. No alcohol or drug use.    REVIEW OF SYSTEMS: CONSTITUTIONAL: No fever, chills, or sweats. Positive for weakness. No weight gain. No weight loss. EYES: She does wear glasses. EARS, NOSE, MOUTH, AND THROAT: No hearing loss. No sore throat. No difficulty swallowing. CARDIOVASCULAR: No chest pain. No palpitations. RESPIRATORY: Positive for shortness of breath going on for a long time. No cough. No sputum. No hemoptysis. GASTROINTESTINAL: Nausea and vomiting on Sunday. Positive for abdominal pain and cramping. Positive for bloody bowel movement today. GENITOURINARY: No burning on urination. No hematuria. MUSCULOSKELETAL: No joint pain or muscle pain. INTEGUMENT: No rashes or eruptions. NEUROLOGIC: No fainting or blackouts. PSYCHIATRIC: On medication for anxiety. HEMATOLOGIC/LYMPHATIC: No anemia.    PHYSICAL EXAMINATION:  VITAL SIGNS: Temperature 97.5, pulse 72, respirations 20, blood pressure 95/58, pulse oximetry 95% on oxygen, 92% on room air on presentation.   GENERAL: No respiratory distress. Patient lying flat in bed.   EYES: Conjunctivae and lids  normal. Pupils equal, round, reactive to light. Extraocular muscles intact. No nystagmus.   EARS, NOSE, MOUTH, AND THROAT: Tympanic membranes no erythema. Nasal mucosa no erythema. Throat no erythema. No exudate seen. Lips and gums no lesions.   NECK: No JVD. No bruits. No  lymphadenopathy. No thyromegaly. No thyroid nodules palpated.   RESPIRATORY: Lungs clear to auscultation. No use of accessory muscles to breathe. No rhonchi, rales, or wheeze heard.   CARDIOVASCULAR: S1, S2 normal. Positive 3/6 systolic ejection murmur. Carotid upstroke 2+ bilaterally. No bruits.   EXTREMITIES: Dorsalis pedis pulses 2+ bilaterally. 1+ edema lower extremities.   ABDOMEN: Soft. Positive tenderness throughout the entire abdomen, worse in the lower quadrants. No organomegaly/splenomegaly. Normoactive bowel sounds. No masses felt.   LYMPHATIC: No lymph nodes in the neck.   MUSCULOSKELETAL: No clubbing, edema, or cyanosis.   SKIN: No rashes or ulcers seen.   NEUROLOGIC: Cranial nerves II through XII grossly intact. Deep tendon reflexes 1+ bilateral lower extremity. Patient is able to straight leg raise bilaterally.   PSYCHIATRIC: Patient is oriented to person, place, and time.   LABORATORY, DIAGNOSTIC, AND RADIOLOGICAL DATA: White blood cell count 7.5, hemoglobin and hematocrit 11.7 and 36.1, platelet count 222, glucose 112, BUN 14, creatinine 0.97, sodium 138, potassium 4.3, chloride 102, CO2 30, calcium 9.5. Liver function tests normal range. INR 5.1. EKG not done, ordered by me.   ASSESSMENT AND PLAN:  1. Gastrointestinal bleed with coagulopathy and abdominal pain. Can consider CT scan of the abdomen and pelvis but with the GI bleed I will hold off for right now. Will put on IV fluid hydration. Clear liquid diet. Will give 1 unit of FFP. Vitamin K already ordered by ER physician. Will order serial hemoglobins. No need to transfuse red blood cells currently but may need during the hospitalization if continues to bleed. We will get a GI consult in the a.m. Hold aspirin and Coumadin at this point.  2. Hypotension. I think the patient's blood pressure normally runs on the lower side because she is not on much for medications. Will give IV fluid hydration. Hold Bumex at this time.   3. Atrial fibrillation. Hold anticoagulation with GI bleed. Continue digoxin at this point.  4. History of cerebrovascular accident with right-sided weakness. Will hold anticoagulation at this point.  5. Gastroesophageal reflux disease. Switch omeprazole to IV Protonix.  6. Chronic obstructive pulmonary disease with chronic respiratory failure on oxygen at night. Continue Spiriva.  7. Anxiety, depression. Continue Paxil.   TIME SPENT ON ADMISSION: 55 minutes.   CODE STATUS: Patient is a DO NOT RESUSCITATE.   ____________________________ Herschell Dimes. Renae Gloss, MD rjw:cms D: 11/15/2011 19:24:04 ET T: 11/16/2011 05:49:45 ET JOB#: [id number]  cc: Herschell Dimes. Renae Gloss, MD, <Dictator> Mickie Hillier. Sheppard Penton, MD Salley Scarlet MD ELECTRONICALLY SIGNED 11/19/2011 18:14

## 2014-12-22 NOTE — Consult Note (Signed)
Chief Complaint:   Subjective/Chief Complaint repeat episopde of recta bleeding today.  hemodynamically stable, hgb stable.  Now with sob, concern for possible pe.  CT chest pending.   VITAL SIGNS/ANCILLARY NOTES: **Vital Signs.:   21-Mar-13 15:39   Vital Signs Type Routine   Temperature Temperature (F) 97.7   Celsius 36.5   Temperature Source oral   Pulse Pulse 72   Pulse source per Dinamap   Respirations Respirations 20   Systolic BP Systolic BP 122   Diastolic BP (mmHg) Diastolic BP (mmHg) 74   Mean BP 90   BP Source Dinamap   Pulse Ox % Pulse Ox % 97   Pulse Ox Activity Level  At rest   Oxygen Delivery 5L   Brief Assessment:   Cardiac Irregular    Respiratory loss pf percussion posterior inferior left lung fields, clear upper left, clear right    Gastrointestinal details normal Soft  Nontender  Nondistended  No masses palpable  Bowel sounds normal   Routine Hem:  18-Mar-13 15:21    Hemoglobin (CBC) 11.7  19-Mar-13 01:23    Hemoglobin (CBC) 9.7    04:03    Hemoglobin (CBC) 9.3    09:30    Hemoglobin (CBC) 10.1    17:56    Hemoglobin (CBC) 10.4  20-Mar-13 03:59    Hemoglobin (CBC) 9.3  21-Mar-13 11:22    Hemoglobin (CBC) 9.6   Hematocrit (CBC) 29.8  Lab:  21-Mar-13 17:15    pH (ABG) 7.41   PCO2 39   PO2 59   FiO2 36   Base Excess 0.1   HCO3 24.7   O2 Saturation 90.4   Patient Temp (ABG) 37.0   Radiology Results: XRay:    21-Mar-13 13:15, Chest Portable Single View   Chest Portable Single View    REASON FOR EXAM:    shortness of breath  COMMENTS:       PROCEDURE: DXR - DXR PORTABLE CHEST SINGLE VIEW  - Nov 18 2011  1:15PM     RESULT: Comparison: 11/08/2009    Findings:     Single portable AP chest radiograph is provided. There is bilateral   diffuse interstitial thickening. There is left pleural effusion. There is   more confluent left lower lobe airspace disease which may resent   atelectasis versus infiltrate. The findings are concerning for  pulmonary   edema versus interstitial pneumonitis secondary to an infectious or   inflammatory etiology.  The heart size is enlarged. There is a single lead cardiac pacer noted.   The osseous structures are unremarkable.    IMPRESSION:     Please see above.    Dictation Site: 2          Verified By: Joellyn HaffHETAL P. PATEL, M.D., MD   Assessment/Plan:  Assessment/Plan:   Assessment 1) rectal bleeding.  2) new shortness of breath/concern for PE 3) multiple medical problems with AF on coumadin as o/p, now held.    Plan 1) will plan for flex sig tomorrow, with prep in am as clinically feasible.  Clear liquid diet.   Electronic Signatures: Barnetta ChapelSkulskie, Domnique Vanegas (MD)  (Signed 21-Mar-13 18:14)  Authored: Chief Complaint, VITAL SIGNS/ANCILLARY NOTES, Brief Assessment, Lab Results, Radiology Results, Assessment/Plan   Last Updated: 21-Mar-13 18:14 by Barnetta ChapelSkulskie, Havard Radigan (MD)
